# Patient Record
Sex: Female | Born: 1985 | Race: Black or African American | Hispanic: No | Marital: Single | Smoking: Current every day smoker
Health system: Southern US, Community
[De-identification: ages and names within clinical notes are randomized; demographics above are authoritative.]

## PROBLEM LIST (undated history)

## (undated) ENCOUNTER — Inpatient Hospital Stay (HOSPITAL_COMMUNITY): Payer: Self-pay

## (undated) DIAGNOSIS — O139 Gestational [pregnancy-induced] hypertension without significant proteinuria, unspecified trimester: Secondary | ICD-10-CM

## (undated) DIAGNOSIS — R51 Headache: Secondary | ICD-10-CM

## (undated) DIAGNOSIS — A549 Gonococcal infection, unspecified: Secondary | ICD-10-CM

## (undated) HISTORY — PX: TUBAL LIGATION: SHX77

## (undated) HISTORY — PX: INDUCED ABORTION: SHX677

---
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
## 2006-04-14 ENCOUNTER — Ambulatory Visit (HOSPITAL_COMMUNITY): Admission: RE | Admit: 2006-04-14 | Discharge: 2006-04-14 | Payer: Self-pay | Admitting: Obstetrics and Gynecology

## 2006-05-10 ENCOUNTER — Ambulatory Visit (HOSPITAL_COMMUNITY): Admission: RE | Admit: 2006-05-10 | Discharge: 2006-05-10 | Payer: Self-pay | Admitting: Obstetrics & Gynecology

## 2006-07-24 ENCOUNTER — Ambulatory Visit: Payer: Self-pay | Admitting: Obstetrics and Gynecology

## 2006-07-27 ENCOUNTER — Ambulatory Visit: Payer: Self-pay | Admitting: Obstetrics & Gynecology

## 2006-07-27 ENCOUNTER — Inpatient Hospital Stay (HOSPITAL_COMMUNITY): Admission: AD | Admit: 2006-07-27 | Discharge: 2006-07-29 | Payer: Self-pay | Admitting: Obstetrics & Gynecology

## 2007-04-20 ENCOUNTER — Inpatient Hospital Stay (HOSPITAL_COMMUNITY): Admission: AD | Admit: 2007-04-20 | Discharge: 2007-04-20 | Payer: Self-pay | Admitting: Gynecology

## 2007-07-03 ENCOUNTER — Ambulatory Visit (HOSPITAL_COMMUNITY): Admission: RE | Admit: 2007-07-03 | Discharge: 2007-07-03 | Payer: Self-pay | Admitting: Obstetrics & Gynecology

## 2007-12-13 ENCOUNTER — Inpatient Hospital Stay (HOSPITAL_COMMUNITY): Admission: RE | Admit: 2007-12-13 | Discharge: 2007-12-15 | Payer: Self-pay | Admitting: Obstetrics & Gynecology

## 2007-12-13 ENCOUNTER — Ambulatory Visit: Payer: Self-pay | Admitting: Family Medicine

## 2009-07-09 ENCOUNTER — Inpatient Hospital Stay (HOSPITAL_COMMUNITY): Admission: RE | Admit: 2009-07-09 | Discharge: 2009-07-09 | Payer: Self-pay | Admitting: Obstetrics and Gynecology

## 2009-07-10 ENCOUNTER — Inpatient Hospital Stay (HOSPITAL_COMMUNITY): Admission: RE | Admit: 2009-07-10 | Discharge: 2009-07-13 | Payer: Self-pay | Admitting: Obstetrics and Gynecology

## 2009-07-10 ENCOUNTER — Encounter (INDEPENDENT_AMBULATORY_CARE_PROVIDER_SITE_OTHER): Payer: Self-pay | Admitting: Obstetrics and Gynecology

## 2009-07-16 ENCOUNTER — Emergency Department (HOSPITAL_COMMUNITY): Admission: EM | Admit: 2009-07-16 | Discharge: 2009-07-16 | Payer: Self-pay | Admitting: Family Medicine

## 2009-07-16 ENCOUNTER — Inpatient Hospital Stay (HOSPITAL_COMMUNITY): Admission: AD | Admit: 2009-07-16 | Discharge: 2009-07-16 | Payer: Self-pay | Admitting: Obstetrics and Gynecology

## 2010-01-24 ENCOUNTER — Encounter: Payer: Self-pay | Admitting: Internal Medicine

## 2010-03-21 LAB — COMPREHENSIVE METABOLIC PANEL
ALT: 18 U/L (ref 0–35)
ALT: 45 U/L — ABNORMAL HIGH (ref 0–35)
AST: 25 U/L (ref 0–37)
Albumin: 2.9 g/dL — ABNORMAL LOW (ref 3.5–5.2)
Alkaline Phosphatase: 127 U/L — ABNORMAL HIGH (ref 39–117)
Alkaline Phosphatase: 93 U/L (ref 39–117)
GFR calc Af Amer: >60 mL/min (ref >60)
Glucose, Bld: 80 mg/dL (ref 70–99)
Potassium: 3.8 mEq/L (ref 3.5–5.1)
Potassium: 4 mEq/L (ref 3.5–5.1)
Sodium: 136 mEq/L (ref 135–145)
Sodium: 137 mEq/L (ref 135–145)
Total Protein: 6.2 g/dL (ref 6.0–8.3)
Total Protein: 6.4 g/dL (ref 6.0–8.3)

## 2010-03-21 LAB — CBC
HCT: 33.2 % — ABNORMAL LOW (ref 36.0–46.0)
HCT: 36.6 % (ref 36.0–46.0)
Hemoglobin: 11.2 g/dL — ABNORMAL LOW (ref 12.0–15.0)
Hemoglobin: 12.3 g/dL (ref 12.0–15.0)
MCH: 30.9 pg (ref 26.0–34.0)
MCHC: 33.7 g/dL (ref 30.0–36.0)
MCV: 90.6 fL (ref 78.0–100.0)
Platelets: 233 10*3/uL (ref 150–400)
RBC: 3.83 MIL/uL — ABNORMAL LOW (ref 3.87–5.11)
RDW: 14.9 % (ref 11.5–15.5)
WBC: 12.8 10*3/uL — ABNORMAL HIGH (ref 4.0–10.5)
WBC: 9.9 10*3/uL (ref 4.0–10.5)

## 2010-03-21 LAB — URINE MICROSCOPIC-ADD ON

## 2010-03-21 LAB — RPR: RPR Ser Ql: NONREACTIVE

## 2010-03-21 LAB — URINALYSIS, ROUTINE W REFLEX MICROSCOPIC
Glucose, UA: NEGATIVE mg/dL
Ketones, ur: NEGATIVE mg/dL
Protein, ur: NEGATIVE mg/dL

## 2010-03-21 LAB — LACTATE DEHYDROGENASE: LDH: 199 U/L (ref 94–250)

## 2010-05-18 NOTE — Discharge Summary (Signed)
NAME:  [name], [name]             ACCOUNT NO.:  [id number]   MEDICAL RECORD NO.:  [id number]          PATIENT TYPE:  INP   LOCATION:  9111                          FACILITY:  [facility]   PHYSICIAN:  Tilda Burrow, M.D. DATE OF BIRTH:  1985/02/17   DATE OF ADMISSION:  12/13/2007  DATE OF DISCHARGE:                               DISCHARGE SUMMARY   At the time of her admission, the patient was a gravida 3, para 2-0-0-3  at 39 weeks with preoperative diagnosis of previous low transverse  cesarean section at term with desire for repeat postoperative diagnosis  is repeat low-transverse cesarean section at term.   PROCEDURE PERFORMED:  Repeat low-transverse cesarean section.   ADMISSION COURSE:  Please refer to the patient's written H&P, but in  brief, she is a 25 year old gravida 3, para 2 with one previous C-  section who desires a repeat.   ALLERGIES:  She has no known drug allergies.   PAST MEDICAL HISTORY:  Noncontributory.   FAMILY HISTORY:  Noncontributory.  On admission, her hemoglobin was 11.9.   MEDICATIONS ON ADMISSION:  Prenatal vitamins one p.o. daily.   HOSPITAL COURSE:  The patient's hospital course again with cesarean  section that was performed by Dr. Shawnie Pons and Dr. Noel Gerold on December 13, 2007 at approximately 1300.  The product of that surgery produced an 8  pounds 6 ounce viable female infant with Apgars of 8 and 9.  Please see  the dictated operative note for details of this surgery.  Throughout her  hospital course, her vital signs remained stable.  Her exam is within  normal limits.  On postoperative day #1, her hemoglobin was found to be  10.3, hematocrit 30.1.  On postoperative day #2, the patient desires  early discharge.  She was afebrile with vital signs stable.  Her exam  was within normal limits.  Uterus firm and nontender.  Breasts soft and  nontender.  Lochia:  Scant, rubra.  Her incision or staples were clean,  dry, and intact.  Extremities had no pain  with trace edema.   DISCHARGE DIAGNOSIS:  The patient is a 25 year old gravida 3, para 3-0-0-  3 postoperative day #2 status post repeat low-transverse cesarean  section.   DISCHARGE PLAN:  Discharge home on postoperative day #2, December 15, 2007.  The patient is to receive Depo-Provera 150 mg IM on discharge.  She is breast and bottle feeding.  She is to continue prenatal vitamins  one p.o. daily while breast-feeding, maternal care coordinator through  the baby love nurses to discontinue staples on postoperative day 5  through 7.  She is to follow up at her scheduled appointment in 6 weeks  at the [facility].   DISCHARGE MEDICATIONS:  Prescriptions given for:  1. Percocet 5/325 one to two tablets q. 4-6 h. p.r.n. pain, she was      dispensed #30 with no refill.  2. Ibuprofen 600 mg q.6 h. p.r.n. cramping dispensed #30 with no      refills.      Maylon Cos, C.N.M.      ______________________________  Tilda Burrow, M.D.    SS/MEDQ  D:  12/15/2007  T:  12/15/2007  Job:  [id number]

## 2010-05-18 NOTE — Op Note (Signed)
NAME:  [name], Sherry             ACCOUNT NO.:  [id number]   MEDICAL RECORD NO.:  [id number]          PATIENT TYPE:  INP   LOCATION:  9116                          [facility]  [facility]   PHYSICIAN:  Allie Bossier, MD        DATE OF BIRTH:  11/01/1985   DATE OF PROCEDURE:  07/27/2006  DATE OF DISCHARGE:                               OPERATIVE REPORT   PREOPERATIVE DIAGNOSIS:  Was 41 weeks estimated gestational age and  arrest of descent and dilatation.   POSTOPERATIVE DIAGNOSIS:  Was 41 weeks estimated gestational age and  arrest of descent and dilatation.   PROCEDURE:  Primary low transverse cesarean section.   SURGEON:  Allie Bossier, M.D.   ANESTHESIA:  Epidural Sherron Ales, M.D.   COMPLICATIONS:  None.   ESTIMATED BLOOD LOSS:  600 mL.   SPECIMENS:  Cord blood.   FINDINGS:  1. Living female infant, Apgars 8 and 9 at 1 and 5 minutes, weight 8      pounds 2 ounces.  2. Normal pelvic anatomy.  3. Intact placenta with three-vessel cord.   PROCEDURE:  The risks, benefits, alternatives of surgery were explained,  understood and accepted, consents were signed. She was taken to the  operating room.  Epidural was bolused for surgery. Abdomen was prepped  and draped usual sterile fashion.  A Foley catheter had been placed in  the labor [place] and it drained clear urine throughout the case. After  adequate anesthesia was assured.  A transverse incision was made  approximately 2 cm above the symphysis pubis. Incision was carried down  to the subcutaneous tissue and the fascia.  Bleeding was cauterized with  the Bovie.  Fascial incision was extended bilaterally with Mayo  scissors.  The pyramidalis and the rectus muscles were partially  separated in transverse fashion using electrosurgical technique.  Excellent hemostasis was maintained. Peritoneum was entered with  hemostat.  The peritoneal incision was extended bilaterally and curved  slightly upward taking care to avoid the bladder.  A  bladder blade was  placed, transverse incision was made on the well-developed lower uterine  segment.  Amniotomy was performed with hemostats and clear fluid was  noted.  The uterine incision was extended manually.  The baby was noted  to be in the occiput posterior position with head was delivered from a  vertex lie.  There was a marked amount of caput and molding. Mouth and  nose were suctioned prior to delivery of shoulders.  The cord clamped  and cut. The baby was transferred to the pediatricians for routine care.  Weight and Apgars are listed above.  Cord blood samples obtained.  The  placenta was extracted with traction.  The uterus was left in situ. The  interior of the uterus cleaned with dry lap sponge.  A bladder blade was  placed.  The uterine incision was closed with one layer of 0 chromic  running locking suture.  Excellent hemostasis was noted.  The ovaries  were palpably normal.  The uterine incision was again inspected and  noted to be hemostatic as were the  rectus muscles and rectus fascia.  The fascia was then closed with 0 Vicryl running nonlocking suture.  No  defects were palpable.  The subcutaneous tissue was irrigated clean and dry, it was infiltrated  with 30 mL of 0.5% Marcaine.  A subcuticular closure was done with 3-0  Vicryl.  Steri-Strips were placed across her incision.  She was taken to  recovery in stable condition.      Allie Bossier, MD  Electronically Signed     MCD/MEDQ  D:  07/27/2006  T:  07/28/2006  Job:  [id number]

## 2010-05-18 NOTE — Op Note (Signed)
NAME:  [name], [name]             ACCOUNT NO.:  [id number]   MEDICAL RECORD NO.:  [id number]          PATIENT TYPE:  INP   LOCATION:  9111                          [facility]   PHYSICIAN:  Tanya S. Shawnie Pons, M.D.   DATE OF BIRTH:  08/24/1985   DATE OF PROCEDURE:  12/13/2007  DATE OF DISCHARGE:                               OPERATIVE REPORT   PREOPERATIVE DIAGNOSES:  1. Previous low-transverse cesarean section, at term.  2. Intrauterine pregnancy.   POSTOPERATIVE DIAGNOSES:  1. Previous low-transverse cesarean section, at term.  2. Intrauterine pregnancy.   PROCEDURE:  Repeat low-transverse cesarean section.   SURGEON:  Shelbie Proctor. Shawnie Pons, MD   ASSISTANT:  Odie Sera, DO   ANESTHESIA:  Spinal and local, Octaviano Glow. Pamalee Leyden, MD   FINDINGS:  Viable female infant, Apgars 8 and 9.  Weight 8 pounds 6  ounces.   SPECIMENS:  Placenta to labor and delivery.   ESTIMATED BLOOD LOSS:  1000 mL.   COMPLICATIONS:  None known.   REASON FOR PROCEDURE:  The patient is a 25 year old gravida 3 para 2,  who has had 1 previous cesarean section, who desired repeat.  The  patient was counseled regarding the risks and benefits of the procedure  including risk of increasing scar tissue and worsening outcomes with  more and more C-sections.  The patient wanted to proceed with C-section.   DESCRIPTION OF PROCEDURE:  The patient was taken to the OR.  She was  placed in a supine position with left lateral tilt.  She was prepped and  draped in a usual sterile fashion.  A Foley catheter was placed inside  the bladder.  When the spinal analgesia was felt to be adequate, a  Pfannenstiel incision was made through an old scar and was carried down  to underlying fascia sharply.  The Kocher clamps were used to elevate  the superior and inferior edges of the fascia off the underlying rectus  bluntly laterally and sharply in the midline.  The rectus was then  divided in the midline and the peritoneal cavity  entered.  Blunt  dissection was used to open the incision even further.  The scissors  were used to take down the inferior portion of the rectus, which was  severely adherent in the midline.  This was taken down in layers with  care being taken to avoid the bladder.  An Alexis retractor was placed  inside the incision.  A low-transverse incision was made on the uterus  until the amniotic cavity was entered.  This was done sharply in the  middle and then the incision extended laterally and bluntly.  The  rupture of membranes occurred with an Allis clamp.  Clear fluid was  noted.  Infant was in a vertex position, was female, had a nuchal cord x1,  was brought out and delivered easily.  Cord was clamped x2 and cut.  Infant handed to awaiting peds.  Placenta was delivered without  difficulty.  The membranes were teased out of the uterine cavity.  The  uterine cavity cleaned with a dry lap pad.  Edges of  the uterine  incision grasped with ring forceps.  Uterine incision closed with 0-  Vicryl suture in a locked running fashion.  Two figure-of-eight were  used to control hemostasis.  The fascia was then closed with 0-Vicryl in  a running fashion.  Subcuticular tissue irrigated, any bleeders  cauterized and the skin closed using clips.  A 30 mL of 0.25% Marcaine  were injected about the incision.  Pressure dressing was applied.  All  instrument and lap counts correct x2.  The patient was awakened and  taken to the recovery room in stable condition.      Shelbie Proctor. Shawnie Pons, M.D.  Electronically Signed     TSP/MEDQ  D:  12/13/2007  T:  12/14/2007  Job:  [id number]

## 2010-05-18 NOTE — Consult Note (Signed)
NAME:  [name], [name]             ACCOUNT NO.:  [id number]   MEDICAL RECORD NO.:  [id number]          PATIENT TYPE:  INP   LOCATION:  9116                          [facility]   PHYSICIAN:  Lazaro Arms, M.D.   DATE OF BIRTH:  02-28-1985   DATE OF CONSULTATION:  DATE OF DISCHARGE:  07/29/2006                                 CONSULTATION   REASON FOR CONSULTATION:  [name] is postoperative day #2 for primary C-  section secondary to arrest of labor.  She is a gravida 2, para 2, who  came into labor at 41 weeks.   HOSPITAL COURSE:  Got to be about 9 centimeters and just basically  stopped.  She had a LTCS on July 27, 2006 and her postpartum course has  been without complications.   LABORATORY DATA:  White blood cells this morning are 12.5.  She does  have a hemoglobin of 7.6 and hematocrit of 22.4.   PHYSICAL EXAMINATION:  HEART:  Regular rate and rhythm.  LUNGS:  Clear.  ABDOMEN:  Soft and nontender.  LTCS incision looks great with Steri-  Strips in place.  Fundus is firm at U-2.  Lochia is small and legs are  negative.   IMPRESSION:  Postoperative day #2 status post primary low transverse  cesarean section.   PLAN:  The patient strongly desires discharge so we will let her go home  this afternoon.  She is being given prescriptions for Percocet, Motrin  and iron supplements.  She also has an appointment to follow up next  week for postpartum care.      Jacklyn Shell, C.N.M.      Lazaro Arms, M.D.  Electronically Signed    FC/MEDQ  D:  07/29/2006  T:  07/29/2006  Job:  [id number]

## 2010-05-21 NOTE — Discharge Summary (Signed)
NAME:  [name], [name]             ACCOUNT NO.:  [id number]   MEDICAL RECORD NO.:  [id number]          PATIENT TYPE:  INP   LOCATION:  9116                          [facility]   PHYSICIAN:  Tilda Burrow, M.D. DATE OF BIRTH:  10/24/1985   DATE OF ADMISSION:  07/27/2006  DATE OF DISCHARGE:  07/29/2006                               DISCHARGE SUMMARY   HOSPITAL COURSE:  Trial of labor which resulted in a low transverse  cesarean section without complications.   LABORATORY DATA:  Hemoglobin was 7.6, hematocrit 22.4.   PHYSICAL EXAMINATION:  HEART:  Regular rate and rhythm.  LUNGS:  Clear to auscultation bilaterally.  ABDOMEN:  Soft and nontender.  Bowel sounds are active.  Incision intact  with Steri-strips in place. No redness, drainage or swelling.  Fundus is  firm.  PELVIC:  Lochia is a small amount.  EXTREMITIES:  Legs are without edema.   IMPRESSION:  Postoperative day 2 status post low transverse cesarean  section.  We are going to discharge her home with prescriptions for  Percocet, Motrin, and iron supplements.  She also has an appointment to  follow up next week in the office for her postpartum care.      Zerita Boers, Lanier Clam      Tilda Burrow, M.D.  Electronically Signed    DL/MEDQ  D:  98/11/9145  T:  09/04/2006  Job:  [id number]   cc:   [facility]

## 2010-05-21 NOTE — Discharge Summary (Signed)
NAME:  [name], [name]             ACCOUNT NO.:  [id number]   MEDICAL RECORD NO.:  [id number]          PATIENT TYPE:  INP   LOCATION:  9167                          [facility]   PHYSICIAN:  Ginger Carne, MD  DATE OF BIRTH:  01/05/1985   DATE OF ADMISSION:  06/04/2005  DATE OF DISCHARGE:  06/05/2005                                 DISCHARGE SUMMARY   REASON FOR HOSPITALIZATION:  A 41 and 0/7ths week gestation for induction  for post dates.   IN-HOSPITAL PROCEDURES:  Cervidil induction placement.   FINAL DIAGNOSIS:  A 41 and 1/7ths week gestation with failed medical  induction, uninducible cervix.   HISTORY OF PRESENT ILLNESS:  This is a 25 year old African-American female  gravida 1, para 0, EDC May 28, 2005 by LMP and second trimester ultrasound,  presenting at 49 and 0/7ths weeks gestation for induction.  The patient was  long, closed, thick and -2 to -3 station.  Once Cervidil was placed with  minimal benefit due to the patient's uninducible cervix and lack of change  over the course of the evening, the patient was reevaluated.  Her cervix  remained unchanged.  A biophysical profile including an AFI demonstrated an  AFI of 12.3 with 8/8 on BPP.  The patient's nonstress test pattern  demonstrated no decelerations.  Good acceleration pattern noted.  The  patient, therefore, was discharged and will return to the high risk clinic  for a nonstress test on June 08, 2005 and was rescheduled for June 10, 2005  for induction, at which time she will be  41 and 6/7ths weeks gestation.  Labor precautions discussed.  She can take Tylenol 1-2 every 6 hours as  needed for discomfort.      Ginger Carne, MD  Electronically Signed     SHB/MEDQ  D:  06/05/2005  T:  06/06/2005  Job:  [id number]

## 2010-09-04 ENCOUNTER — Emergency Department (HOSPITAL_COMMUNITY)
Admission: EM | Admit: 2010-09-04 | Discharge: 2010-09-04 | Disposition: A | Discharge status: Home / Self Care | Payer: Self-pay | Attending: Emergency Medicine | Admitting: Emergency Medicine

## 2010-09-04 DIAGNOSIS — J069 Acute upper respiratory infection, unspecified: Secondary | ICD-10-CM | POA: Insufficient documentation

## 2010-09-04 DIAGNOSIS — J029 Acute pharyngitis, unspecified: Secondary | ICD-10-CM | POA: Insufficient documentation

## 2010-09-04 DIAGNOSIS — R5383 Other fatigue: Secondary | ICD-10-CM | POA: Insufficient documentation

## 2010-09-04 DIAGNOSIS — IMO0001 Reserved for inherently not codable concepts without codable children: Secondary | ICD-10-CM | POA: Insufficient documentation

## 2010-09-04 DIAGNOSIS — R5381 Other malaise: Secondary | ICD-10-CM | POA: Insufficient documentation

## 2010-09-04 DIAGNOSIS — J3489 Other specified disorders of nose and nasal sinuses: Secondary | ICD-10-CM | POA: Insufficient documentation

## 2010-09-04 LAB — URINALYSIS, ROUTINE W REFLEX MICROSCOPIC
Glucose, UA: NEGATIVE mg/dL
Hgb urine dipstick: NEGATIVE
Protein, ur: 30 mg/dL — AB
Specific Gravity, Urine: 1.024 (ref 1.005–1.030)
pH: 7 (ref 5.0–8.0)

## 2010-09-04 LAB — URINE MICROSCOPIC-ADD ON

## 2010-09-28 LAB — POCT PREGNANCY, URINE
Operator id: 292781
Preg Test, Ur: POSITIVE

## 2010-09-28 LAB — WET PREP, GENITAL

## 2010-09-28 LAB — CBC
HCT: 39.4
Hemoglobin: 13.6
MCV: 89.4
RBC: 4.41
WBC: 10.5

## 2010-10-07 LAB — CBC
HCT: 30.1 % — ABNORMAL LOW (ref 36.0–46.0)
Hemoglobin: 10.3 g/dL — ABNORMAL LOW (ref 12.0–15.0)
Hemoglobin: 11.9 g/dL — ABNORMAL LOW (ref 12.0–15.0)
MCHC: 33.5 g/dL (ref 30.0–36.0)
MCV: 91.2 fL (ref 78.0–100.0)
MCV: 92 fL (ref 78.0–100.0)
Platelets: 202 10*3/uL (ref 150–400)
RBC: 3.88 MIL/uL (ref 3.87–5.11)
RDW: 14.8 % (ref 11.5–15.5)
WBC: 12.4 10*3/uL — ABNORMAL HIGH (ref 4.0–10.5)
WBC: 12.7 10*3/uL — ABNORMAL HIGH (ref 4.0–10.5)

## 2010-10-18 LAB — CBC
HCT: 34.4 — ABNORMAL LOW
Hemoglobin: 11.7 — ABNORMAL LOW
MCHC: 34
MCHC: 35
MCV: 91.2
MCV: 91.2
Platelets: 193
Platelets: 215
Platelets: 240
RBC: 2.45 — ABNORMAL LOW
RBC: 2.52 — ABNORMAL LOW
RDW: 14.7 — ABNORMAL HIGH
WBC: 12.5 — ABNORMAL HIGH

## 2011-01-07 IMAGING — US US AMNIOCENTESIS
1 series · 7 of 7 positions shown · non-contrast
Comparison: none

ULTRASOUND-GUIDED AMNIOCENTESIS:
 Ultrasound was utilized to perform amniocentesis by the requesting physician.

[Series 1: us amniocentesis · 7 of 7 slices shown]
[im 1/7]
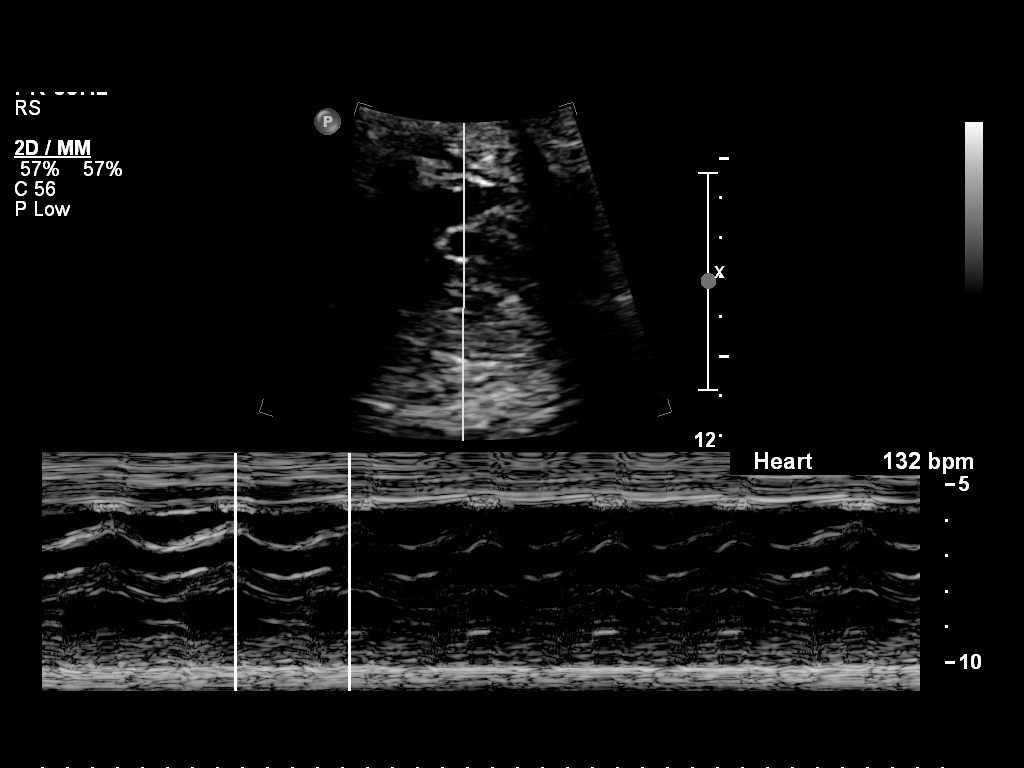
[im 2/7]
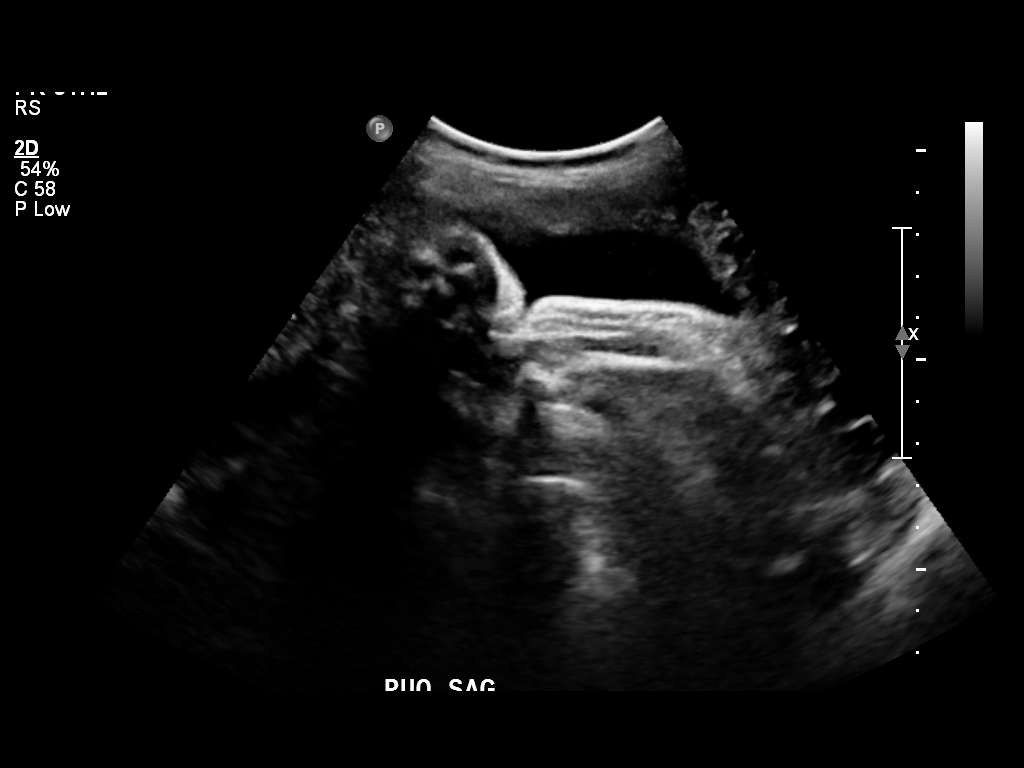
[im 3/7]
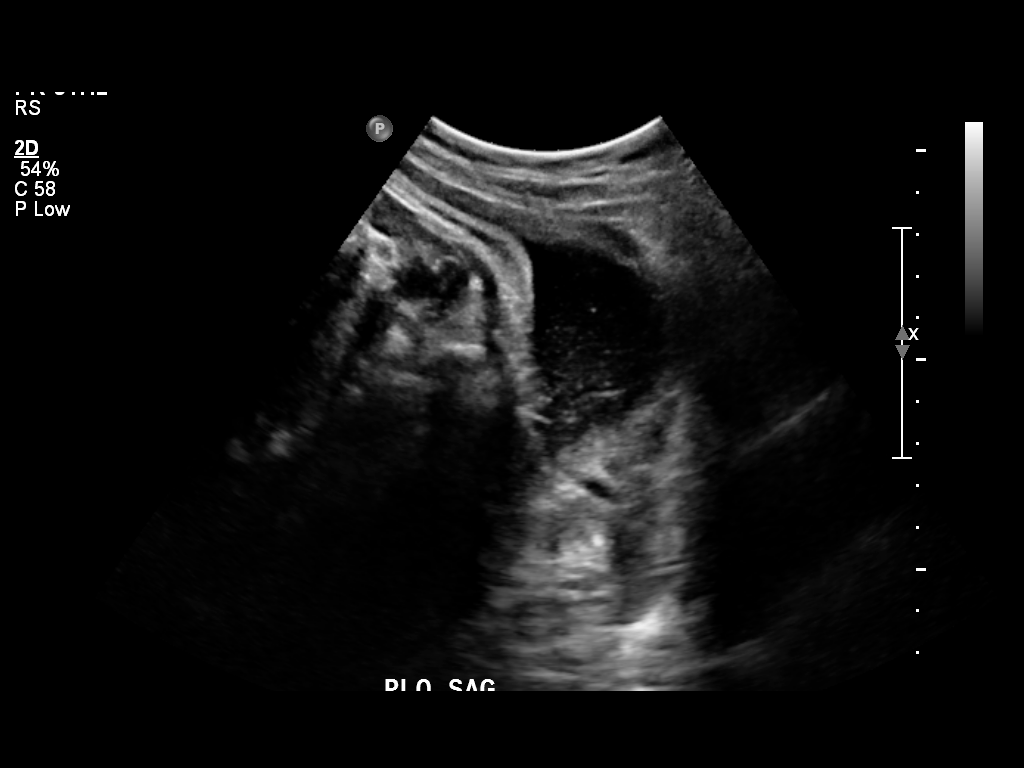
[im 4/7]
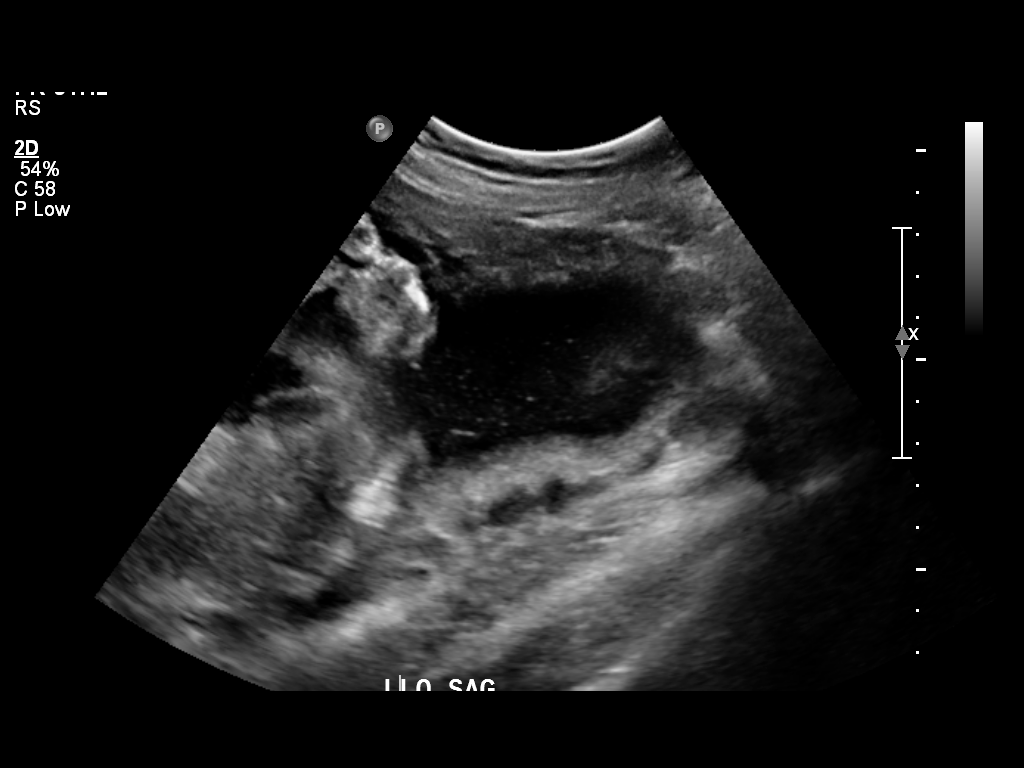
[im 5/7]
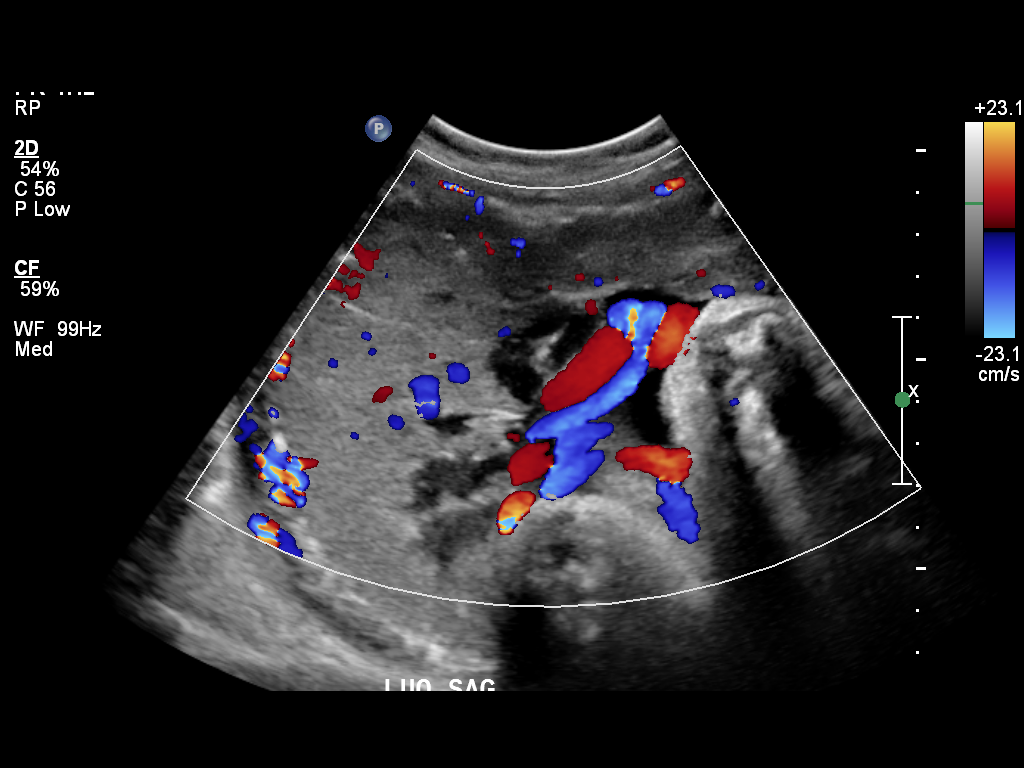
[im 6/7]
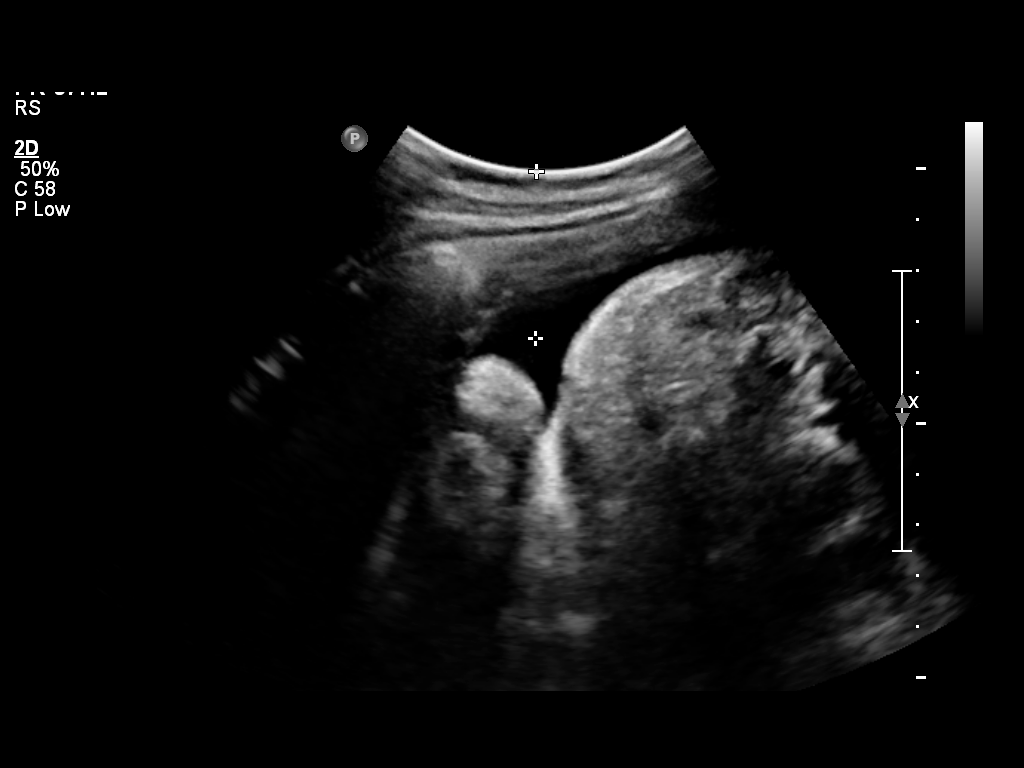
[im 7/7]
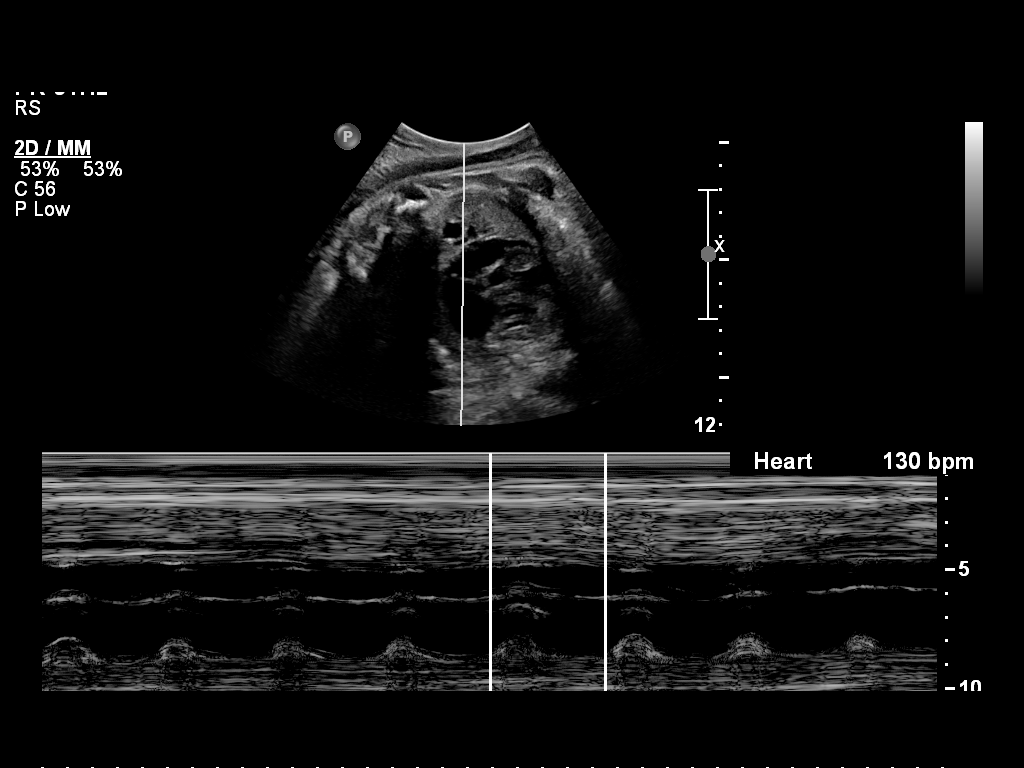

[7 of 7 positions shown; findings below may reference images not displayed]

IMPRESSION: No radiographic interpretation.

## 2011-05-17 ENCOUNTER — Inpatient Hospital Stay (HOSPITAL_COMMUNITY)
Admission: AD | Admit: 2011-05-17 | Discharge: 2011-05-17 | Disposition: A | Discharge status: Home / Self Care | Payer: Self-pay | Source: Ambulatory Visit | Attending: Obstetrics & Gynecology | Admitting: Obstetrics & Gynecology

## 2011-05-17 ENCOUNTER — Encounter (HOSPITAL_COMMUNITY): Payer: Self-pay

## 2011-05-17 DIAGNOSIS — O479 False labor, unspecified: Secondary | ICD-10-CM

## 2011-05-17 DIAGNOSIS — O2390 Unspecified genitourinary tract infection in pregnancy, unspecified trimester: Secondary | ICD-10-CM | POA: Insufficient documentation

## 2011-05-17 DIAGNOSIS — M545 Low back pain: Secondary | ICD-10-CM

## 2011-05-17 DIAGNOSIS — B9689 Other specified bacterial agents as the cause of diseases classified elsewhere: Secondary | ICD-10-CM | POA: Insufficient documentation

## 2011-05-17 DIAGNOSIS — A499 Bacterial infection, unspecified: Secondary | ICD-10-CM | POA: Insufficient documentation

## 2011-05-17 DIAGNOSIS — M549 Dorsalgia, unspecified: Secondary | ICD-10-CM | POA: Insufficient documentation

## 2011-05-17 DIAGNOSIS — O99891 Other specified diseases and conditions complicating pregnancy: Secondary | ICD-10-CM | POA: Insufficient documentation

## 2011-05-17 DIAGNOSIS — N760 Acute vaginitis: Secondary | ICD-10-CM | POA: Insufficient documentation

## 2011-05-17 HISTORY — DX: Gonococcal infection, unspecified: A54.9

## 2011-05-17 HISTORY — DX: Headache: R51

## 2011-05-17 HISTORY — DX: Gestational (pregnancy-induced) hypertension without significant proteinuria, unspecified trimester: O13.9

## 2011-05-17 LAB — URINALYSIS, ROUTINE W REFLEX MICROSCOPIC
Bilirubin Urine: NEGATIVE
Glucose, UA: NEGATIVE mg/dL
Hgb urine dipstick: NEGATIVE
Ketones, ur: NEGATIVE mg/dL
Leukocytes, UA: NEGATIVE
Nitrite: NEGATIVE
Protein, ur: NEGATIVE mg/dL
Specific Gravity, Urine: 1.01 (ref 1.005–1.030)
Urobilinogen, UA: 0.2 mg/dL (ref 0.0–1.0)
pH: 7 (ref 5.0–8.0)

## 2011-05-17 LAB — GC/CHLAMYDIA PROBE AMP, GENITAL
Chlamydia, DNA Probe: NEGATIVE
GC Probe Amp, Genital: NEGATIVE

## 2011-05-17 LAB — WET PREP, GENITAL
Trich, Wet Prep: NONE SEEN
Yeast Wet Prep HPF POC: NONE SEEN

## 2011-05-17 MED ORDER — METRONIDAZOLE 500 MG PO TABS: 500.0000 mg | ORAL_TABLET | Freq: Two times a day (BID) | ORAL | Status: AC

## 2011-05-17 MED ORDER — PRENATAL VITAMINS (DIS) PO TABS: 1.0000 | ORAL_TABLET | Freq: Every day | ORAL | Status: DC

## 2011-05-17 MED ORDER — ACETAMINOPHEN 325 MG PO TABS: 325.0000 mg | ORAL_TABLET | Freq: Four times a day (QID) | ORAL | Status: DC | PRN

## 2011-05-17 NOTE — MAU Note (Signed)
Leg pain started 2 wks ago, was hoping it would go away or could wait until she saw a regular doctor.  Dizziness is off and on, had dizzy spell yesterday at work.

## 2011-05-17 NOTE — MAU Note (Signed)
Patient states she has had no prenatal care. Has had three previous cesareans. States she has been having hip and leg pain for about 2 weeks, worse when moving. Has a thick vaginal discharge with slight odor.

## 2011-05-17 NOTE — MAU Provider Note (Signed)
Attestation of Attending Supervision of Advanced Practitioner: Evaluation and management procedures were performed by the OB Fellow/PA/CNM/NP under my supervision and collaboration. Chart reviewed, and agree with management and plan.  Payslie Mccaig, M.D. 05/17/2011 5:51 PM   

## 2011-05-17 NOTE — MAU Provider Note (Signed)
  History     CSN: [id number]  Arrival date and time: 05/17/11 6644   First Provider Initiated Contact with Patient 05/17/11 [phone]      Chief Complaint  Patient presents with  . Hip Pain   HPI CC: Back and leg pain, vaginal discharge, lightheadedness   Back pain: Lower back pain off and on for 2 wks that is ache in nature. Aggrevated w/ walking and certain movements. Alleviated w/ rest. Occasionally associated w/ leg pain (R>L) in her thighs, but no numbness, loss of strength, loss of bowel or bladder function.   Vaginal discharge: whitish discharge for the past wk associated w/ a foul odor. Denies any new sexual partners, vaginal bleeding. Has had previous STD. Sexually active w/ FOB. Last intercourse several days ago.   Lightheadedness: Onset 5 days ago. Occasionally lightheaded while standing for long periods of time while at work. Denies orthostatic symptoms, syncope, HA, palpitations, SOB, CP, change in sensation, HA. Denies decreased PO but admits to poor daily water/fluid intake.    Reports regular fetal movement and no contractions  OB History    Grav Para Term Preterm Abortions TAB SAB Ect Mult Living   6 4 4  0 1 1 0 0 0 4      Past Medical History  Diagnosis Date  . Headache   . Gonorrhea   . Pregnancy induced hypertension     readm 1 wk after 4th delivery    Past Surgical History  Procedure Date  . Cesarean section   . Induced abortion     Family History  Problem Relation Age of Onset  . Anesthesia problems Neg Hx     History  Substance Use Topics  . Smoking status: Former Games developer  . Smokeless tobacco: Never Used   Comment: quit in March 2013  . Alcohol Use: No    Allergies: No Known Allergies  No prescriptions prior to admission    ROS Physical Exam   Blood pressure 118/67, pulse 93, temperature 98 F (36.7 C), temperature source Oral, resp. rate 16, height 5\' 5"  (1.651 m), weight 79.833 kg (176 lb), last menstrual period 12/04/2010, SpO2  100.00%, unknown if currently breastfeeding.  Physical Exam  Gen: WN/WD, no distress CV: RRR Abd: Non-painful to palpation, uterus firm and above the umbilicus GU: whitish, no erythema or bleeding. Cervix long, thick, and closed on speculum though difficult to appreciate.  Ext: No edema   MAU Course  Procedures  MDM GC/Chlamydia Wet prep Orthostatics UA  Assessment and Plan  26yo [redacted]w[redacted]d [id number] w/ typical pregnancy induced back pain and likely early sciatica, w/ BV and pending GC/Chlamydia, w/ need for better fluid intake. No cardiac cause of lightheadedness. Monitoring w/  reasurring fetal strip and absent of contractions. - Metro 500mg  BID 7days for BV - F/U GC chlamydia - Tylenol for pain - PNV - Pt to establish PNC at health department - Increase fluid intake - Reasons for return discussed and handout given - Labor precautions discussed   MERRELL, DAVID, MD 05/17/2011, 10:28 AM   I have seen and examined this patient and I agree with the above. Cam Hai 12:13 PM 05/17/2011

## 2011-05-17 NOTE — Discharge Instructions (Signed)
Thank you for coming in today. We will let you know the results of your lab work if anything comes back abnormal. Please increase your fluid intake throughout the day, as this will help with the lightheadedness. Please take tylenol as needed for your back pain. Massage and warm compresses will also help.   Please go to the health department as soon as possible to start your prenatal care. This is very important for your health and the health of the baby.   Please start taking a prenatal vitamin daily.     Pregnancy - Third Trimester The third trimester begins at the 28th week of pregnancy and ends at birth. It is important to follow your doctor's instructions. HOME CARE   Go to your doctor's visits.   Do not smoke.   Do not drink alcohol or use drugs.   Only take medicine as told by your doctor.   Take prenatal vitamins as told. The vitamin should contain 1 milligram of folic acid.   Exercise.   Eat healthy foods. Eat regular, well-balanced meals.   You can have sex (intercourse) if there are no other problems with the pregnancy.   Do not use hot tubs, steam rooms, or saunas.   Wear a seat belt while driving.   Avoid raw meat, uncooked cheese, and litter boxes and soil used by cats.   Rest with your legs raised (elevated).   Make a list of emergency phone numbers. Keep this list with you.   Arrange for help when you come back home after delivering the baby.   Make a trial run to the hospital.   Take prenatal classes.   Prepare the baby's nursery.   Do not travel out of the city. If you absolutely have to, get permission from your doctor first.   Wear flat shoes. Do not wear high heels.  GET HELP RIGHT AWAY IF:   You have a temperature by mouth above 102 F (38.9 C), not controlled by medicine.   You have not felt the baby move for more than 1 hour. If you think the baby is not moving as much as normal, eat something with sugar in it or lie down on your left side  for an hour. The baby should move at least 4 to 5 times per hour.   Fluid is coming from the vagina.   Blood is coming from the vagina. Light spotting is common, especially after sex (intercourse).   You have belly (abdominal) pain.   You have a bad smelling fluid (discharge) coming from the vagina. The fluid changes from clear to white.   You still feel sick to your stomach (nauseous).   You throw up (vomit) for more than 24 hours.   You have the chills.   You have shortness of breath.   You have a burning feeling when you pee (urinate).   You lose or gain more than 2 pounds (0.9 kilograms) of weight over a week, or as told by your doctor.   Your face, hands, feet, or legs get puffy (swell).   You have a bad headache that will not go away.   You start to have problems seeing (blurry or double vision).   You fall, are in a car accident, or have any kind of trauma.   There is mental or physical violence at home.   You have any concerns or worries during your pregnancy.  MAKE SURE YOU:   Understand these instructions.   Will watch  your condition.   Will get help right away if you are not doing well or get worse.  Document Released: 03/16/2009 Document Revised: 12/09/2010 Document Reviewed: 03/16/2009 [facility] Patient Information 2012 [place], [place]. Sciatica Sciatica is a weakness and/or changes in sensation (tingling, jolts, hot and cold, numbness) along the path the sciatic nerve travels. Irritation or damage to lumbar nerve roots is often also referred to as lumbar radiculopathy.  Lumbar radiculopathy (Sciatica) is the most common form of this problem. Radiculopathy can occur in any of the nerves coming out of the spinal cord. The problems caused depend on which nerves are involved. The sciatic nerve is the large nerve supplying the branches of nerves going from the hip to the toes. It often causes a numbness or weakness in the skin and/or muscles that the sciatic nerve  serves. It also may cause symptoms (problems) of pain, burning, tingling, or electric shock-like feelings in the path of this nerve. This usually comes from injury to the fibers that make up the sciatic nerve. Some of these symptoms are low back pain and/or unpleasant feelings in the following areas:  From the mid-buttock down the back of the leg to the back of the knee.   And/or the outside of the calf and top of the foot.   And/or behind the inner ankle to the sole of the foot.  CAUSES   Herniated or slipped disc. Discs are the little cushions between the bones in the back.   Pressure by the piriformis muscle in the buttock on the sciatic nerve (Piriformis Syndrome).   Misalignment of the bones in the lower back and buttocks (Sacroiliac Joint Derangement).   Narrowing of the spinal canal that puts pressure on or pinches the fibers that make up the sciatic nerve.   A slipped vertebra that is out of line with those above or beneath it.   Abnormality of the nervous system itself so that nerve fibers do not transmit signals properly, especially to feet and calves (neuropathy).   Tumor (this is rare).  Your caregiver can usually determine the cause of your sciatica and begin the treatment most likely to help you. TREATMENT  Taking over-the-counter painkillers, physical therapy, rest, exercise, spinal manipulation, and injections of anesthetics and/or steroids may be used. Surgery, acupuncture, and Yoga can also be effective. Mind over matter techniques, mental imagery, and changing factors such as your bed, chair, desk height, posture, and activities are other treatments that may be helpful. You and your caregiver can help determine what is best for you. With proper diagnosis, the cause of most sciatica can be identified and removed. Communication and cooperation between your caregiver and you is essential. If you are not successful immediately, do not be discouraged. With time, a proper  treatment can be found that will make you comfortable. HOME CARE INSTRUCTIONS   If the pain is coming from a problem in the back, applying ice to that area for 15 to 20 minutes, 3 to 4 times per day while awake, may be helpful. Put the ice in a plastic bag. Place a towel between the bag of ice and your skin.   You may exercise or perform your usual activities if these do not aggravate your pain, or as suggested by your caregiver.   Only take over-the-counter or prescription medicines for pain, discomfort, or fever as directed by your caregiver.   If your caregiver has given you a follow-up appointment, it is very important to keep that appointment. Not keeping  the appointment could result in a chronic or permanent injury, pain, and disability. If there is any problem keeping the appointment, you must call back to this facility for assistance.  SEEK IMMEDIATE MEDICAL CARE IF:   You experience loss of control of bowel or bladder.   You have increasing weakness in the trunk, buttocks, or legs.   There is numbness in any areas from the hip down to the toes.   You have difficulty walking or keeping your balance.   You have any of the above, with fever or forceful vomiting.  Document Released: 12/14/2000 Document Revised: 12/09/2010 Document Reviewed: 08/03/2007 [facility] Patient Information 2012 [place], [place].

## 2011-07-27 ENCOUNTER — Other Ambulatory Visit (HOSPITAL_COMMUNITY): Payer: Self-pay | Admitting: Physician Assistant

## 2011-07-27 DIAGNOSIS — Z0489 Encounter for examination and observation for other specified reasons: Secondary | ICD-10-CM

## 2011-07-27 LAB — OB RESULTS CONSOLE ABO/RH: RH Type: POSITIVE

## 2011-07-27 LAB — OB RESULTS CONSOLE GC/CHLAMYDIA
Chlamydia: NEGATIVE
Gonorrhea: NEGATIVE

## 2011-07-27 LAB — OB RESULTS CONSOLE ANTIBODY SCREEN: Antibody Screen: NEGATIVE

## 2011-07-29 ENCOUNTER — Ambulatory Visit (HOSPITAL_COMMUNITY)
Admission: RE | Admit: 2011-07-29 | Discharge: 2011-07-29 | Disposition: A | Discharge status: Home / Self Care | Payer: Medicaid Other | Source: Ambulatory Visit | Attending: Physician Assistant | Admitting: Physician Assistant

## 2011-07-29 DIAGNOSIS — O358XX0 Maternal care for other (suspected) fetal abnormality and damage, not applicable or unspecified: Secondary | ICD-10-CM | POA: Insufficient documentation

## 2011-07-29 DIAGNOSIS — Z0489 Encounter for examination and observation for other specified reasons: Secondary | ICD-10-CM

## 2011-07-29 DIAGNOSIS — Z363 Encounter for antenatal screening for malformations: Secondary | ICD-10-CM | POA: Insufficient documentation

## 2011-07-29 DIAGNOSIS — Z1389 Encounter for screening for other disorder: Secondary | ICD-10-CM | POA: Insufficient documentation

## 2011-07-29 DIAGNOSIS — O09299 Supervision of pregnancy with other poor reproductive or obstetric history, unspecified trimester: Secondary | ICD-10-CM | POA: Insufficient documentation

## 2011-08-03 ENCOUNTER — Other Ambulatory Visit: Payer: Self-pay | Admitting: Obstetrics & Gynecology

## 2011-08-20 ENCOUNTER — Encounter (HOSPITAL_COMMUNITY): Payer: Self-pay | Admitting: Pharmacist

## 2011-08-29 ENCOUNTER — Encounter (HOSPITAL_COMMUNITY)
Admission: RE | Admit: 2011-08-29 | Discharge: 2011-08-29 | Disposition: A | Discharge status: Home / Self Care | Payer: Medicaid Other | Source: Ambulatory Visit | Attending: Obstetrics & Gynecology | Admitting: Obstetrics & Gynecology

## 2011-08-29 ENCOUNTER — Encounter (HOSPITAL_COMMUNITY): Payer: Self-pay

## 2011-08-29 DIAGNOSIS — Z3009 Encounter for other general counseling and advice on contraception: Secondary | ICD-10-CM

## 2011-08-29 LAB — CBC
MCH: 30.3 pg (ref 26.0–34.0)
MCHC: 33.9 g/dL (ref 30.0–36.0)
Platelets: 228 10*3/uL (ref 150–400)
RDW: 14.5 % (ref 11.5–15.5)

## 2011-08-29 LAB — RPR: RPR Ser Ql: NONREACTIVE

## 2011-08-29 LAB — TYPE AND SCREEN: Antibody Screen: NEGATIVE

## 2011-08-29 LAB — RAPID HIV SCREEN (WH-MAU): Rapid HIV Screen: NONREACTIVE

## 2011-08-29 LAB — SURGICAL PCR SCREEN: MRSA, PCR: INVALID — AB

## 2011-08-29 NOTE — Patient Instructions (Addendum)
YOUR PROCEDURE IS SCHEDULED ON:09/03/11  ENTER THROUGH THE MAIN ENTRANCE OF [facility] AT:0730am  USE DESK PHONE AND DIAL [id number] TO INFORM us OF YOUR ARRIVAL  CALL [phone] IF YOU HAVE ANY QUESTIONS OR PROBLEMS PRIOR TO YOUR ARRIVAL.  REMEMBER: DO NOT EAT OR DRINK AFTER MIDNIGHT THE NIGHT BEFORE YOUR SURGERY    SPECIAL INSTRUCTIONS: FOLLOW HIBICLENS GUIDELINES   YOU MAY BRUSH YOUR TEETH THE MORNING OF SURGERY   TAKE THESE MEDICINES THE DAY OF SURGERY WITH SIP OF WATER: none   DO NOT WEAR JEWELRY, EYE MAKEUP, LIPSTICK OR DARK FINGERNAIL POLISH DO NOT WEAR LOTIONS  DO NOT SHAVE FOR 48 HOURS PRIOR TO SURGERY   YOU WILL NOT BE ALLOWED TO DRIVE YOURSELF HOME.

## 2011-09-01 LAB — MRSA CULTURE

## 2011-09-03 ENCOUNTER — Encounter (HOSPITAL_COMMUNITY): Payer: Self-pay | Admitting: Anesthesiology

## 2011-09-03 ENCOUNTER — Inpatient Hospital Stay (HOSPITAL_COMMUNITY)
Admission: RE | Admit: 2011-09-03 | Discharge: 2011-09-06 | DRG: 766 | Disposition: A | Discharge status: Home / Self Care | Payer: Medicaid Other | Source: Ambulatory Visit | Attending: Obstetrics & Gynecology | Admitting: Obstetrics & Gynecology

## 2011-09-03 ENCOUNTER — Encounter (HOSPITAL_COMMUNITY): Payer: Self-pay

## 2011-09-03 ENCOUNTER — Encounter (HOSPITAL_COMMUNITY)
Admission: RE | Disposition: A | Discharge status: Home / Self Care | Payer: Self-pay | Source: Ambulatory Visit | Attending: Obstetrics & Gynecology

## 2011-09-03 ENCOUNTER — Inpatient Hospital Stay (HOSPITAL_COMMUNITY): Payer: Medicaid Other | Admitting: Anesthesiology

## 2011-09-03 DIAGNOSIS — O34219 Maternal care for unspecified type scar from previous cesarean delivery: Principal | ICD-10-CM | POA: Diagnosis present

## 2011-09-03 DIAGNOSIS — Z22330 Carrier of Group B streptococcus: Secondary | ICD-10-CM

## 2011-09-03 DIAGNOSIS — Z302 Encounter for sterilization: Secondary | ICD-10-CM

## 2011-09-03 DIAGNOSIS — Z01818 Encounter for other preprocedural examination: Secondary | ICD-10-CM

## 2011-09-03 DIAGNOSIS — O99892 Other specified diseases and conditions complicating childbirth: Secondary | ICD-10-CM | POA: Diagnosis present

## 2011-09-03 DIAGNOSIS — O9989 Other specified diseases and conditions complicating pregnancy, childbirth and the puerperium: Secondary | ICD-10-CM

## 2011-09-03 DIAGNOSIS — Z98891 History of uterine scar from previous surgery: Secondary | ICD-10-CM

## 2011-09-03 DIAGNOSIS — Z01812 Encounter for preprocedural laboratory examination: Secondary | ICD-10-CM

## 2011-09-03 LAB — TYPE AND SCREEN
ABO/RH(D): O POS
Antibody Screen: NEGATIVE

## 2011-09-03 SURGERY — Surgical Case
Anesthesia: Spinal | Site: Abdomen | Wound class: Clean Contaminated

## 2011-09-03 MED ORDER — MENTHOL 3 MG MT LOZG: 1.0000 | LOZENGE | OROMUCOSAL | Status: DC | PRN

## 2011-09-03 MED ORDER — DIPHENHYDRAMINE HCL 50 MG/ML IJ SOLN: 12.5000 mg | INTRAMUSCULAR | Status: DC | PRN

## 2011-09-03 MED ORDER — NALBUPHINE HCL 10 MG/ML IJ SOLN
5.0000 mg | INTRAMUSCULAR | Status: DC | PRN
  Filled 2011-09-03: qty 1

## 2011-09-03 MED ORDER — OXYTOCIN 40 UNITS IN LACTATED RINGERS INFUSION - SIMPLE MED: 62.5000 mL/h | INTRAVENOUS | Status: AC

## 2011-09-03 MED ORDER — PHENYLEPHRINE 40 MCG/ML (10ML) SYRINGE FOR IV PUSH (FOR BLOOD PRESSURE SUPPORT)
PREFILLED_SYRINGE | INTRAVENOUS | Status: AC
  Filled 2011-09-03: qty 5

## 2011-09-03 MED ORDER — DIPHENHYDRAMINE HCL 50 MG/ML IJ SOLN: 25.0000 mg | INTRAMUSCULAR | Status: DC | PRN

## 2011-09-03 MED ORDER — OXYCODONE-ACETAMINOPHEN 5-325 MG PO TABS
1.0000 | ORAL_TABLET | ORAL | Status: DC | PRN
  Administered 2011-09-03 – 2011-09-04 (×2): 2 via ORAL
  Administered 2011-09-04: 1 via ORAL
  Administered 2011-09-04 (×2): 2 via ORAL
  Administered 2011-09-04: 1 via ORAL
  Administered 2011-09-05 (×2): 2 via ORAL
  Administered 2011-09-05: 1 via ORAL
  Administered 2011-09-06: 2 via ORAL
  Filled 2011-09-03 (×3): qty 2
  Filled 2011-09-03: qty 1
  Filled 2011-09-03: qty 2
  Filled 2011-09-03: qty 1
  Filled 2011-09-03 (×4): qty 2
  Filled 2011-09-03: qty 1

## 2011-09-03 MED ORDER — KETOROLAC TROMETHAMINE 60 MG/2ML IM SOLN
INTRAMUSCULAR | Status: AC
  Administered 2011-09-03: 60 mg via INTRAMUSCULAR
  Filled 2011-09-03: qty 2

## 2011-09-03 MED ORDER — SCOPOLAMINE 1 MG/3DAYS TD PT72
1.0000 | MEDICATED_PATCH | Freq: Once | TRANSDERMAL | Status: DC
  Administered 2011-09-03: 1.5 mg via TRANSDERMAL

## 2011-09-03 MED ORDER — NALOXONE HCL 0.4 MG/ML IJ SOLN: 0.4000 mg | INTRAMUSCULAR | Status: DC | PRN

## 2011-09-03 MED ORDER — ONDANSETRON HCL 4 MG/2ML IJ SOLN
INTRAMUSCULAR | Status: DC | PRN
  Administered 2011-09-03: 4 mg via INTRAVENOUS

## 2011-09-03 MED ORDER — IBUPROFEN 600 MG PO TABS
600.0000 mg | ORAL_TABLET | Freq: Four times a day (QID) | ORAL | Status: DC
Start: 2011-09-03 — End: 2011-09-06
  Administered 2011-09-03 – 2011-09-06 (×11): 600 mg via ORAL
  Filled 2011-09-03 (×9): qty 1

## 2011-09-03 MED ORDER — LACTATED RINGERS IV SOLN
INTRAVENOUS | Status: DC
  Administered 2011-09-03: 125 mL/h via INTRAVENOUS
  Administered 2011-09-03: 09:00:00 via INTRAVENOUS

## 2011-09-03 MED ORDER — ONDANSETRON HCL 4 MG PO TABS: 4.0000 mg | ORAL_TABLET | ORAL | Status: DC | PRN

## 2011-09-03 MED ORDER — EPHEDRINE 5 MG/ML INJ
INTRAVENOUS | Status: AC
  Filled 2011-09-03: qty 10

## 2011-09-03 MED ORDER — SCOPOLAMINE 1 MG/3DAYS TD PT72
MEDICATED_PATCH | TRANSDERMAL | Status: AC
  Filled 2011-09-03: qty 1

## 2011-09-03 MED ORDER — CEFAZOLIN SODIUM-DEXTROSE 2-3 GM-% IV SOLR
2.0000 g | INTRAVENOUS | Status: AC
  Administered 2011-09-03: 2 g via INTRAVENOUS

## 2011-09-03 MED ORDER — LACTATED RINGERS IV SOLN
INTRAVENOUS | Status: DC | PRN
  Administered 2011-09-03: 10:00:00 via INTRAVENOUS

## 2011-09-03 MED ORDER — SIMETHICONE 80 MG PO CHEW: 80.0000 mg | CHEWABLE_TABLET | ORAL | Status: DC | PRN

## 2011-09-03 MED ORDER — ONDANSETRON HCL 4 MG/2ML IJ SOLN: 4.0000 mg | Freq: Three times a day (TID) | INTRAMUSCULAR | Status: DC | PRN

## 2011-09-03 MED ORDER — DIBUCAINE 1 % RE OINT: 1.0000 "application " | TOPICAL_OINTMENT | RECTAL | Status: DC | PRN

## 2011-09-03 MED ORDER — NALBUPHINE HCL 10 MG/ML IJ SOLN
5.0000 mg | INTRAMUSCULAR | Status: DC | PRN
  Administered 2011-09-03: 10 mg via SUBCUTANEOUS
  Filled 2011-09-03 (×2): qty 1

## 2011-09-03 MED ORDER — KETOROLAC TROMETHAMINE 60 MG/2ML IM SOLN
60.0000 mg | Freq: Once | INTRAMUSCULAR | Status: AC | PRN
  Administered 2011-09-03: 60 mg via INTRAMUSCULAR

## 2011-09-03 MED ORDER — SODIUM CHLORIDE 0.9 % IJ SOLN: 3.0000 mL | INTRAMUSCULAR | Status: DC | PRN

## 2011-09-03 MED ORDER — ZOLPIDEM TARTRATE 5 MG PO TABS: 5.0000 mg | ORAL_TABLET | Freq: Every evening | ORAL | Status: DC | PRN

## 2011-09-03 MED ORDER — MEPERIDINE HCL 25 MG/ML IJ SOLN: 6.2500 mg | INTRAMUSCULAR | Status: DC | PRN

## 2011-09-03 MED ORDER — LANOLIN HYDROUS EX OINT: 1.0000 "application " | TOPICAL_OINTMENT | CUTANEOUS | Status: DC | PRN

## 2011-09-03 MED ORDER — KETOROLAC TROMETHAMINE 30 MG/ML IJ SOLN: 30.0000 mg | Freq: Four times a day (QID) | INTRAMUSCULAR | Status: AC | PRN

## 2011-09-03 MED ORDER — SCOPOLAMINE 1 MG/3DAYS TD PT72
1.0000 | MEDICATED_PATCH | Freq: Once | TRANSDERMAL | Status: DC
  Filled 2011-09-03: qty 1

## 2011-09-03 MED ORDER — KETOROLAC TROMETHAMINE 30 MG/ML IJ SOLN
30.0000 mg | Freq: Four times a day (QID) | INTRAMUSCULAR | Status: AC | PRN
  Administered 2011-09-03: 30 mg via INTRAVENOUS
  Filled 2011-09-03: qty 1

## 2011-09-03 MED ORDER — ONDANSETRON HCL 4 MG/2ML IJ SOLN
INTRAMUSCULAR | Status: AC
  Filled 2011-09-03: qty 2

## 2011-09-03 MED ORDER — HYDROMORPHONE HCL PF 1 MG/ML IJ SOLN: 0.2500 mg | INTRAMUSCULAR | Status: DC | PRN

## 2011-09-03 MED ORDER — ONDANSETRON HCL 4 MG/2ML IJ SOLN
4.0000 mg | INTRAMUSCULAR | Status: DC | PRN
Start: 2011-09-03 — End: 2011-09-06

## 2011-09-03 MED ORDER — METOCLOPRAMIDE HCL 5 MG/ML IJ SOLN: 10.0000 mg | Freq: Three times a day (TID) | INTRAMUSCULAR | Status: DC | PRN

## 2011-09-03 MED ORDER — SIMETHICONE 80 MG PO CHEW
80.0000 mg | CHEWABLE_TABLET | Freq: Three times a day (TID) | ORAL | Status: DC
  Administered 2011-09-03 – 2011-09-06 (×11): 80 mg via ORAL

## 2011-09-03 MED ORDER — OXYTOCIN 10 UNIT/ML IJ SOLN
INTRAMUSCULAR | Status: AC
  Filled 2011-09-03: qty 4

## 2011-09-03 MED ORDER — PRENATAL MULTIVITAMIN CH
1.0000 | ORAL_TABLET | Freq: Every day | ORAL | Status: DC
  Administered 2011-09-03 – 2011-09-06 (×4): 1 via ORAL
  Filled 2011-09-03 (×4): qty 1

## 2011-09-03 MED ORDER — FENTANYL CITRATE 0.05 MG/ML IJ SOLN
INTRAMUSCULAR | Status: AC
  Filled 2011-09-03: qty 2

## 2011-09-03 MED ORDER — SODIUM CHLORIDE 0.9 % IV SOLN
1.0000 ug/kg/h | INTRAVENOUS | Status: DC | PRN
  Filled 2011-09-03: qty 2.5

## 2011-09-03 MED ORDER — MORPHINE SULFATE 0.5 MG/ML IJ SOLN
INTRAMUSCULAR | Status: AC
  Filled 2011-09-03: qty 10

## 2011-09-03 MED ORDER — IBUPROFEN 600 MG PO TABS
600.0000 mg | ORAL_TABLET | Freq: Four times a day (QID) | ORAL | Status: DC | PRN
  Filled 2011-09-03 (×2): qty 1

## 2011-09-03 MED ORDER — TETANUS-DIPHTH-ACELL PERTUSSIS 5-2.5-18.5 LF-MCG/0.5 IM SUSP
0.5000 mL | Freq: Once | INTRAMUSCULAR | Status: AC
  Administered 2011-09-04: 0.5 mL via INTRAMUSCULAR
  Filled 2011-09-03: qty 0.5

## 2011-09-03 MED ORDER — DIPHENHYDRAMINE HCL 25 MG PO CAPS
25.0000 mg | ORAL_CAPSULE | ORAL | Status: DC | PRN
  Administered 2011-09-03: 25 mg via ORAL
  Filled 2011-09-03: qty 1

## 2011-09-03 MED ORDER — WITCH HAZEL-GLYCERIN EX PADS: 1.0000 "application " | MEDICATED_PAD | CUTANEOUS | Status: DC | PRN

## 2011-09-03 MED ORDER — OXYTOCIN 40 UNITS IN LACTATED RINGERS INFUSION - SIMPLE MED
INTRAVENOUS | Status: DC | PRN
  Administered 2011-09-03: 40 [IU] via INTRAVENOUS

## 2011-09-03 MED ORDER — CEFAZOLIN SODIUM-DEXTROSE 2-3 GM-% IV SOLR
INTRAVENOUS | Status: AC
  Filled 2011-09-03: qty 50

## 2011-09-03 MED ORDER — DIPHENHYDRAMINE HCL 25 MG PO CAPS: 25.0000 mg | ORAL_CAPSULE | Freq: Four times a day (QID) | ORAL | Status: DC | PRN

## 2011-09-03 MED ORDER — LACTATED RINGERS IV SOLN
INTRAVENOUS | Status: DC
  Administered 2011-09-03 – 2011-09-04 (×3): via INTRAVENOUS

## 2011-09-03 MED ORDER — SENNOSIDES-DOCUSATE SODIUM 8.6-50 MG PO TABS
2.0000 | ORAL_TABLET | Freq: Every day | ORAL | Status: DC
  Administered 2011-09-03 – 2011-09-05 (×3): 2 via ORAL

## 2011-09-03 MED ORDER — PHENYLEPHRINE HCL 10 MG/ML IJ SOLN
INTRAMUSCULAR | Status: DC | PRN
  Administered 2011-09-03: 80 ug via INTRAVENOUS

## 2011-09-03 SURGICAL SUPPLY — 29 items
BARRIER ADHS 3X4 INTERCEED (GAUZE/BANDAGES/DRESSINGS) IMPLANT
BRR ADH 4X3 ABS CNTRL BYND (GAUZE/BANDAGES/DRESSINGS)
CHLORAPREP W/TINT 26ML (MISCELLANEOUS) ×3 IMPLANT
CLIP FILSHIE TUBAL LIGA STRL (Clip) ×3 IMPLANT
CLOTH BEACON ORANGE TIMEOUT ST (SAFETY) ×3 IMPLANT
DRSG COVADERM 4X10 (GAUZE/BANDAGES/DRESSINGS) IMPLANT
ELECT REM PT RETURN 9FT ADLT (ELECTROSURGICAL) ×3
ELECTRODE REM PT RTRN 9FT ADLT (ELECTROSURGICAL) ×2 IMPLANT
EXTRACTOR VACUUM KIWI (MISCELLANEOUS) IMPLANT
GLOVE BIO SURGEON STRL SZ 6.5 (GLOVE) ×3 IMPLANT
GLOVE BIOGEL PI IND STRL 7.0 (GLOVE) ×4 IMPLANT
GLOVE BIOGEL PI INDICATOR 7.0 (GLOVE) ×2
GOWN PREVENTION PLUS LG XLONG (DISPOSABLE) ×9 IMPLANT
KIT ABG SYR 3ML LUER SLIP (SYRINGE) IMPLANT
NEEDLE HYPO 25X5/8 SAFETYGLIDE (NEEDLE) IMPLANT
NS IRRIG 1000ML POUR BTL (IV SOLUTION) ×3 IMPLANT
PACK C SECTION WH (CUSTOM PROCEDURE TRAY) ×3 IMPLANT
PAD OB MATERNITY 4.3X12.25 (PERSONAL CARE ITEMS) IMPLANT
RETRACTOR WND ALEXIS 25 LRG (MISCELLANEOUS) ×2 IMPLANT
RTRCTR WOUND ALEXIS 25CM LRG (MISCELLANEOUS) ×3
SLEEVE SCD COMPRESS KNEE LRG (MISCELLANEOUS) IMPLANT
SLEEVE SCD COMPRESS KNEE MED (MISCELLANEOUS) ×3 IMPLANT
SUT VIC AB 0 CT1 36 (SUTURE) ×18 IMPLANT
SUT VIC AB 2-0 CT1 27 (SUTURE) ×3
SUT VIC AB 2-0 CT1 TAPERPNT 27 (SUTURE) ×2 IMPLANT
SUT VIC AB 4-0 PS2 27 (SUTURE) ×3 IMPLANT
TOWEL OR 17X24 6PK STRL BLUE (TOWEL DISPOSABLE) ×6 IMPLANT
TRAY FOLEY CATH 14FR (SET/KITS/TRAYS/PACK) IMPLANT
WATER STERILE IRR 1000ML POUR (IV SOLUTION) IMPLANT

## 2011-09-03 NOTE — Anesthesia Procedure Notes (Signed)

## 2011-09-03 NOTE — Transfer of Care (Signed)
Immediate Anesthesia Transfer of Care Note  Patient: [name]  Procedure(s) Performed: Procedure(s) (LRB): CESAREAN SECTION WITH BILATERAL TUBAL LIGATION ()  Patient Location: PACU  Anesthesia Type: Spinal  Level of Consciousness: awake, alert  and oriented  Airway & Oxygen Therapy: Patient Spontanous Breathing  Post-op Assessment: Report given to PACU RN and Post -op Vital signs reviewed and stable  Post vital signs: Reviewed and stable  Complications: No apparent anesthesia complications

## 2011-09-03 NOTE — H&P (Addendum)
[name] is a 26 y.o. female presenting for repeat c-section and tubal ligation.  Maternal Medical History:  Reason for admission: Reason for Admission:   nauseaRLTCS  Contractions: Frequency: irregular.   Duration is approximately 2 minutes.   Perceived severity is mild.    Fetal activity: Perceived fetal activity is normal.   Last perceived fetal movement was within the past hour.    Prenatal complications: no prenatal complications Prenatal Complications - Diabetes: none.    OB History    Grav Para Term Preterm Abortions TAB SAB Ect Mult Living   6 4 4  0 1 1 0 0 0 4     Past Medical History  Diagnosis Date  . Gonorrhea   . Headache     H/O migraines  . Pregnancy induced hypertension     readm 1 wk after 4th delivery   Past Surgical History  Procedure Date  . Cesarean section   . Induced abortion    Family History: family history is negative for Anesthesia problems. Social History:  reports that she has quit smoking. She has never used smokeless tobacco. She reports that she does not drink alcohol or use illicit drugs.   Prenatal Transfer Tool  Maternal Diabetes: No Genetic Screening: Declined - presented too late Maternal Ultrasounds/Referrals: Normal Fetal Ultrasounds or other Referrals:  None Maternal Substance Abuse:  No Significant Maternal Medications:  None Significant Maternal Lab Results:  Lab values include: Group B Strep positive Other Comments:  None  Review of Systems  Constitutional: Negative for fever and chills.  Eyes: Negative for blurred vision and double vision.  Respiratory: Negative for shortness of breath.   Cardiovascular: Negative for chest pain.  Gastrointestinal: Negative for nausea, vomiting and abdominal pain.  Neurological: Negative for dizziness and headaches.      Blood pressure 113/68, pulse 88, temperature 98.4 F (36.9 C), temperature source Oral, resp. rate 16, height 5\' 5"  (1.651 m), weight 81.194 kg (179 lb),  last menstrual period 12/04/2010, SpO2 98.00%. Maternal Exam:  Uterine Assessment: Contraction strength is mild.  Contraction frequency is irregular.   Abdomen: Surgical scars: low transverse.   Fundal height is appropriate for gestational age.   Fetal presentation: vertex     Physical Exam  Constitutional: She is oriented to person, place, and time. She appears well-developed and well-nourished. No distress.  HENT:  Head: Normocephalic and atraumatic.  Eyes: Conjunctivae and EOM are normal.  Neck: Normal range of motion.  Cardiovascular: Normal rate, regular rhythm and normal heart sounds.   Respiratory: Effort normal and breath sounds normal.  GI: Soft. There is no tenderness. There is no rebound and no guarding.  Musculoskeletal: She exhibits no edema and no tenderness.  Neurological: She is alert and oriented to person, place, and time.  Skin: Skin is warm and dry.  Psychiatric: She has a normal mood and affect.    Prenatal labs: ABO, Rh: --/--/O POS (08/31 0750) Antibody: PENDING (08/31 0750) Rubella: Immune (07/24 0000) RPR: NON REACTIVE (08/26 1011)  HBsAg: Negative (07/24 0000)  HIV: Non-reactive (08/26 0000)  GBS:   positive  Assessment/Plan: 26 y.o. [id number] at [redacted]w[redacted]d with 3 prior c-sections Repeat c-section and tubal ligation. Late prenatal care. Dating based on LMP c/w 33 wk sono.   Napoleon Form 09/03/2011, 8:35 AM  Agree with note. Patient counseled regarding risks of surgery, anesthesia, bleeding, infection, pain, abdominal organ damage, failure of sterilization including ectopic pregnancy and her questions were answered.  Amit Leece 09/03/2011 8:56 AM

## 2011-09-03 NOTE — Anesthesia Preprocedure Evaluation (Addendum)
Anesthesia Evaluation  Patient identified by MRN, date of birth, ID band Patient awake    Reviewed: Allergy & Precautions, H&P , Patient's Chart, lab work & pertinent test results  Airway Mallampati: II TM Distance: >3 FB Neck ROM: full    Dental No notable dental hx.    Pulmonary  breath sounds clear to auscultation  Pulmonary exam normal       Cardiovascular Exercise Tolerance: Good hypertension (PIH in past; none presently), Rhythm:regular Rate:Normal     Neuro/Psych    GI/Hepatic   Endo/Other    Renal/GU      Musculoskeletal   Abdominal   Peds  Hematology   Anesthesia Other Findings   Reproductive/Obstetrics                         Anesthesia Physical Anesthesia Plan  ASA: II  Anesthesia Plan: Spinal   Post-op Pain Management:    Induction:   Airway Management Planned:   Additional Equipment:   Intra-op Plan:   Post-operative Plan:   Informed Consent: I have reviewed the patients History and Physical, chart, labs and discussed the procedure including the risks, benefits and alternatives for the proposed anesthesia with the patient or authorized representative who has indicated his/her understanding and acceptance.   Dental Advisory Given  Plan Discussed with: CRNA  Anesthesia Plan Comments: (Lab work confirmed with CRNA in room. Platelets okay. Discussed spinal anesthetic, and patient consents to the procedure:  included risk of possible headache,backache, failed block, allergic reaction, and nerve injury. This patient was asked if she had any questions or concerns before the procedure started. )        Anesthesia Quick Evaluation

## 2011-09-03 NOTE — Op Note (Addendum)
[name]  PROCEDURE DATE: 09/03/2011  PREOPERATIVE DIAGNOSIS: Intrauterine pregnancy at [redacted]w[redacted]d gestation; previous LTCS x 3, undesired fertility  POSTOPERATIVE DIAGNOSIS: The same  PROCEDURE: Repeat Low Transverse Cesarean Section, Bilateral Tubal Sterilization using Filshie clips  SURGEON:  Dr. Scheryl Darter  ASSISTANT:  Napoleon Form, MD  ANESTHESIOLOGIST: Cristela Blue, MD, Isabella Bowens. CRNA  INDICATIONS: [name] is a 26 y.o. [id number] at [redacted]w[redacted]d here for cesarean section and bilateral tubal sterilization secondary to the indications listed under preoperative diagnosis; please see preoperative note for further details.  The risks ofsurgery were discussed with the patient including but were not limited to: bleeding which may require transfusion or reoperation; infection which may require antibiotics; injury to bowel, bladder, ureters or other surrounding organs; injury to the fetus; need for additional procedures including hysterectomy in the event of a life-threatening hemorrhage; placental abnormalities wth subsequent pregnancies, incisional problems, thromboembolic phenomenon and other postoperative/anesthesia complications.  Patient also desires permanent sterilization.  Other reversible forms of contraception were discussed with patient; she declines all other modalities. Risks of procedure discussed with patient including but not limited to: risk of regret, permanence of method, bleeding, infection, injury to surrounding organs and need for additional procedures.  Failure risk of 0.5-1% with increased risk of ectopic gestation if pregnancy occurs was also discussed with patient.  The patient concurred with the proposed plan, giving informed written consent for the procedures.    FINDINGS:  Viable female infant in cephalic presentation, ROA position.  APGARs 9 and 9, weight pending. Clear amniotic fluid.  Intact placenta, three vessel cord.  Uterus with very thin lower segment,  fallopian tubes and ovaries bilaterally.  ANESTHESIA: Spinal INTRAVENOUS FLUIDS: 3100 ml LR ESTIMATED BLOOD LOSS: 700 ml URINE OUTPUT:  100 ml SPECIMENS: Placenta sent to L&D COMPLICATIONS: None immediate  PROCEDURE IN DETAIL:  The patient preoperatively received intravenous antibiotics and had sequential compression devices applied to her lower extremities.   She was then taken to the operating room where spinal anesthesia was administered and was found to be adequate. She was then placed in a dorsal supine position with a leftward tilt, and prepped and draped in a sterile manner.  A foley catheter was placed into her bladder and attached to constant gravity.  After an adequate timeout was performed, a Pfannenstiel skin incision was made with scalpel and carried through to the underlying layer of fascia. The fascia was incised in the midline, and this incision was extended bilaterally using the Mayo scissors.  Kocher clamps were applied to the superior aspect of the fascial incision and the underlying rectus muscles were dissected off bluntly and sharply. A similar process was carried out on the inferior aspect of the fascial incision. The rectus muscles were found to be already separated. Significant scar tissue was encountered requiring sharp and blunt dissection and cautery to enter the peritoneum. Alexis-O ring was insertec. Attention was turned to the lower uterine segment, which was extremely thin. A low transverse hysterotomy was made with a scalpel and extended bilaterally bluntly. The infant was successfully delivered, the cord was clamped and cut and the infant was handed over to awaiting neonatology team. Uterine massage was then administered, and the placenta delivered intact with a three-vessel cord. The uterus was then cleared of clot and debris.  The hysterotomy was closed with 0 Vicryl in a running locked fashion, and an imbricating layer was also placed with a 0 Vicryl suture. Attention  was then turned to the fallopian tubes.  A  Filshie clip was placed on both tubes, about 3 cm from the cornua, with care given to incorporate the underlying mesosalpinx on both sides, allowing for bilateral tubal sterilization. The pelvis was cleared of all clot and debris. Hemostasis was confirmed on all surfaces.  The peritoneum was reapproximated using 2-0 Vicryl in running fashion. The fascia was then closed using 0 Vicryl in a running fashion.  The subcutaneous layer was irrigated. The skin was closed with a 4-0 Vicryl subcuticular stitch. Pressure dressing was applied. The patient tolerated the procedure well. Sponge, lap, instrument and needle counts were correct x 2.  She was taken to the recovery room in stable condition.   Napoleon Form, MD

## 2011-09-03 NOTE — OR Nursing (Signed)
Placenta to or utility

## 2011-09-03 NOTE — Addendum Note (Signed)
Addendum  created 09/03/11 1810 by Len Blalock, CRNA   Modules edited:Notes Section

## 2011-09-03 NOTE — Anesthesia Postprocedure Evaluation (Signed)
  Anesthesia Post-op Note  Patient: [name]  Procedure(s) Performed: Procedure(s) (LRB): CESAREAN SECTION WITH BILATERAL TUBAL LIGATION ()  Patient Location: PACU and Mother/Baby  Anesthesia Type: Spinal  Level of Consciousness: awake, alert  and oriented  Airway and Oxygen Therapy: Patient Spontanous Breathing   Post-op Assessment: Patient's Cardiovascular Status Stable and Respiratory Function Stable  Post-op Vital Signs: stable  Complications: No apparent anesthesia complications

## 2011-09-03 NOTE — Anesthesia Postprocedure Evaluation (Signed)
  Anesthesia Post-op Note  Patient: [name]  Procedure(s) Performed: Procedure(s) (LRB): CESAREAN SECTION WITH BILATERAL TUBAL LIGATION ()   Patient is awake, responsive, moving her legs, and has signs of resolution of her numbness. Pain and nausea are reasonably well controlled. Vital signs are stable and clinically acceptable. Oxygen saturation is clinically acceptable. There are no apparent anesthetic complications at this time. Patient is ready for discharge.

## 2011-09-04 LAB — CBC
Platelets: 199 10*3/uL (ref 150–400)
RBC: 3.57 MIL/uL — ABNORMAL LOW (ref 3.87–5.11)
RDW: 14.7 % (ref 11.5–15.5)
WBC: 13.8 10*3/uL — ABNORMAL HIGH (ref 4.0–10.5)

## 2011-09-04 NOTE — Progress Notes (Signed)
Subjective: Postpartum Day 1: Cesarean Delivery Patient reports no problems.  Mild incisional pain controlled by meds.  RN just removed foley cath, hasn't voided yet.  Not yet passing gas.  BF well.  Eating and drinking well.   Objective: Vital signs in last 24 hours: Temp:  [97.5 F (36.4 C)-98.5 F (36.9 C)] 98.5 F (36.9 C) (09/01 0520) Pulse Rate:  [52-88] 53  (09/01 0520) Resp:  [16-22] 18  (09/01 0520) BP: (101-134)/(59-79) 118/71 mmHg (09/01 0520) SpO2:  [97 %-100 %] 99 % (09/01 0520)  Physical Exam:  General: alert, cooperative and no distress Lochia: appropriate Uterine Fundus: firm Incision: covered by dressing- no drainage on dressing DVT Evaluation: No evidence of DVT seen on physical exam. Negative Homan's sign. No cords or calf tenderness. No significant calf/ankle edema.   Basename 09/04/11 0557  HGB 10.9*  HCT 32.2*    Assessment/Plan: Status post Cesarean section. Doing well postoperatively.  Continue current care.  Marge Duncans 09/04/2011, 7:34 AM

## 2011-09-04 NOTE — Progress Notes (Signed)
Spoke with MOB briefly.  No hx of dep, she states two years ago she had high blood pressure that caused some symptoms that looked liked depression.  No current emotional concerns.  Provided feelings after birth brochure.   Patient was referred for history of depression/anxiety. * Referral screened out by Clinical Social Worker because none of the following criteria appear to apply: ~ History of anxiety/depression during this pregnancy, or of post-partum depression. ~ Diagnosis of anxiety and/or depression within last 3 years ~ History of depression due to pregnancy loss/loss of child OR * Patient's symptoms currently being treated with medication and/or therapy. Please contact the Clinical Social Worker if needs arise, or by the patient's request.

## 2011-09-05 NOTE — Progress Notes (Signed)
UR chart review completed.  

## 2011-09-05 NOTE — Progress Notes (Signed)
Post Partum Day 2 Subjective: no complaints, up ad lib, voiding and tolerating PO, small lochia, plans to breastfeed, s/p BTS, pain meds working well  Objective: Blood pressure 116/68, pulse 68, temperature 99 F (37.2 C), temperature source Oral, resp. rate 18, height 5\' 5"  (1.651 m), weight 81.194 kg (179 lb), last menstrual period 12/04/2010, SpO2 97.00%, unknown if currently breastfeeding.  Physical Exam:  General: alert, cooperative and no distress Lochia:normal flow Chest: CTAB Heart: RRR no m/r/g Abdomen: +BS, soft, nontender, low transverse incision c/d/i Uterine Fundus: firm DVT Evaluation: No evidence of DVT seen on physical exam. Extremities: neg edema   Basename 09/04/11 0557  HGB 10.9*  HCT 32.2*    Assessment/Plan: POD#2 RLTCS w/BTS Breastfeeding Plan for discharge tomorrow   LOS: 2 days   [name], [name] 09/05/2011, 7:50 AM   I have seen and examined this patient and agree the above assessment. [name],[name] 09/05/2011 11:04 AM

## 2011-09-06 DIAGNOSIS — Z98891 History of uterine scar from previous surgery: Secondary | ICD-10-CM

## 2011-09-06 MED ORDER — OXYCODONE-ACETAMINOPHEN 5-325 MG PO TABS: 1.0000 | ORAL_TABLET | ORAL | Status: AC | PRN

## 2011-09-06 MED ORDER — SENNOSIDES-DOCUSATE SODIUM 8.6-50 MG PO TABS: 2.0000 | ORAL_TABLET | Freq: Every day | ORAL | Status: AC

## 2011-09-06 NOTE — Discharge Summary (Signed)
Obstetric Discharge Summary  Postoperative Day 3   Reason for Admission: cesarean section Prenatal Procedures: none Intrapartum Procedures: cesarean: low cervical, transverse and tubal ligation Postpartum Procedures: none Complications-Operative and Postpartum: none Hemoglobin  Date Value Range Status  09/04/2011 10.9* 12.0 - 15.0 g/dL Final     HCT  Date Value Range Status  09/04/2011 32.2* 36.0 - 46.0 % Final    Physical Exam:  General: alert, cooperative, appears stated age and no distress CV: RRR, 2+ DP pulses bilaterally PULM: CTAB, nl effort ABD: NABS, soft, nontender, nondistended Lochia: appropriate Uterine Fundus: firm Incision: healing well, no significant drainage, no dehiscence, no significant erythema DVT Evaluation: No evidence of DVT seen on physical exam. No significant calf/ankle edema.  Discharge Diagnoses: Term Pregnancy-delivered  Discharge Information: Date: 09/06/2011 Activity: pelvic rest Diet: routine Medications: PNV, Colace and Percocet Condition: stable Instructions: refer to practice specific booklet Discharge to: home Breastfeeding  Newborn Data: Live born female  Birth Weight: 6 lb 6.3 oz (2900 g) APGAR: 9, 9  Home with mother.  Simone Curia 09/06/2011, 8:05 AM

## 2011-09-06 NOTE — Discharge Summary (Signed)
I saw and examined patient along with student and agree with above note.   [name] 11:12 AM, 09/06/11

## 2011-09-08 ENCOUNTER — Encounter (HOSPITAL_COMMUNITY): Payer: Self-pay | Admitting: *Deleted

## 2013-01-26 IMAGING — US US OB DETAIL+14 WK
1 of 2 series · 12 of 28 positions shown · non-contrast
Comparison: none

[Series 1: us ob detail +14 wk · 83 acquisitions, 12 frames shown]
[im 1/83]
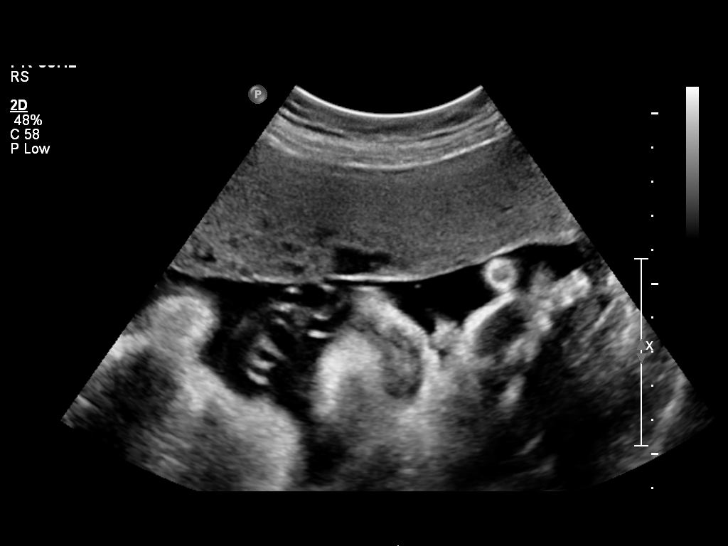
[im 7/83]
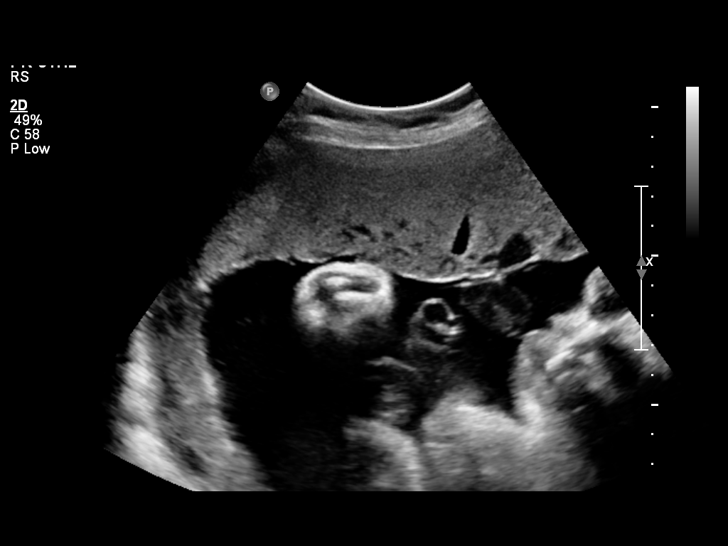
[im 13/83]
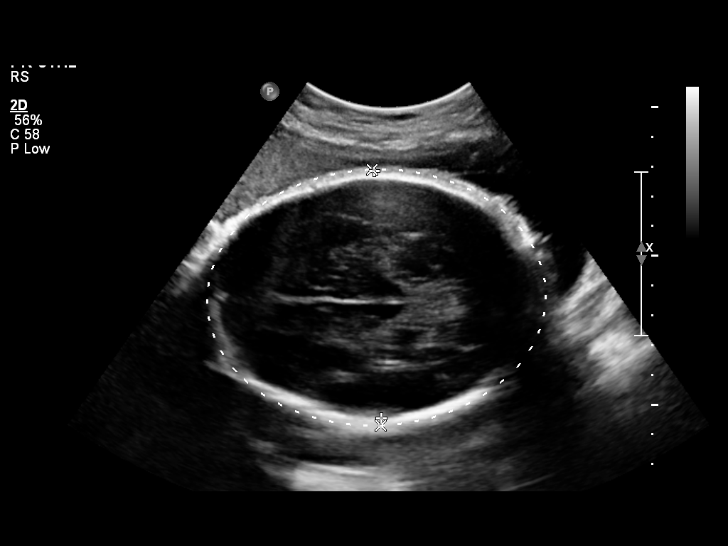
[im 23/83]
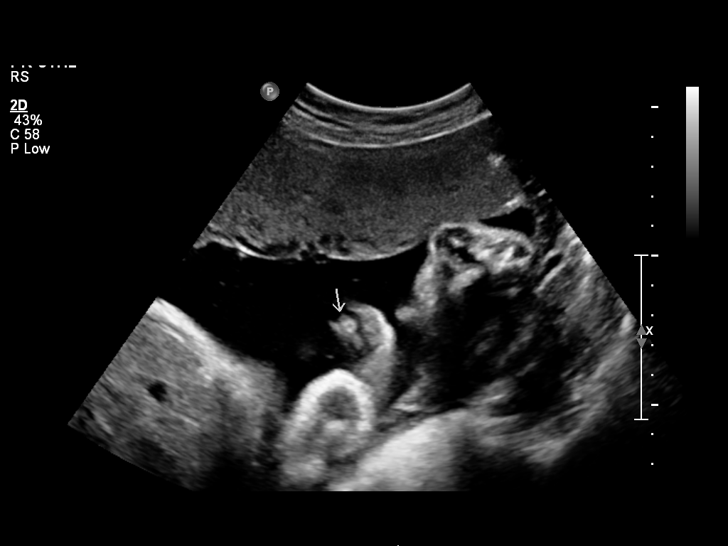
[im 29/83]
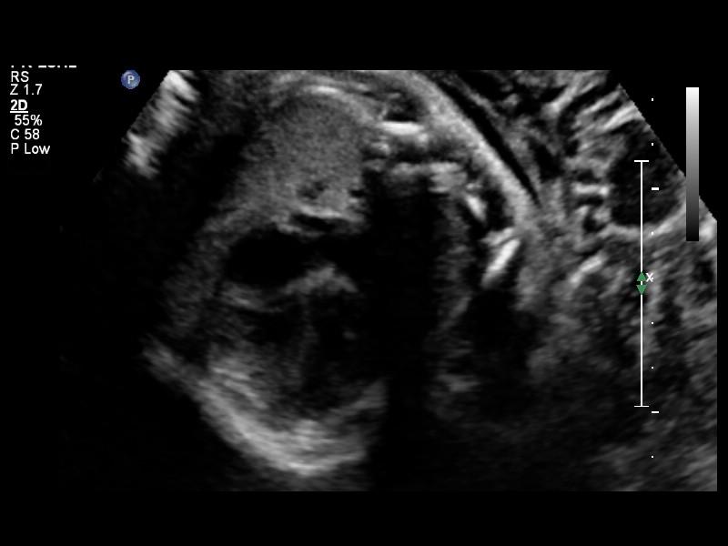
[im 35/83]
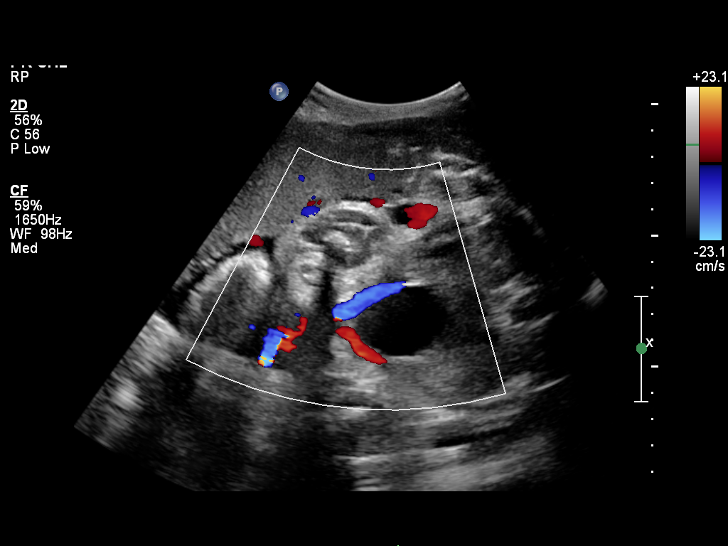
[im 45/83]
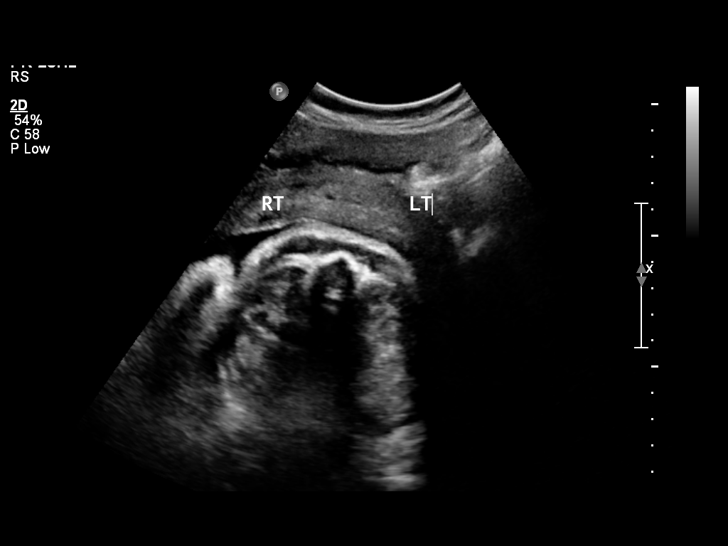
[im 51/83]
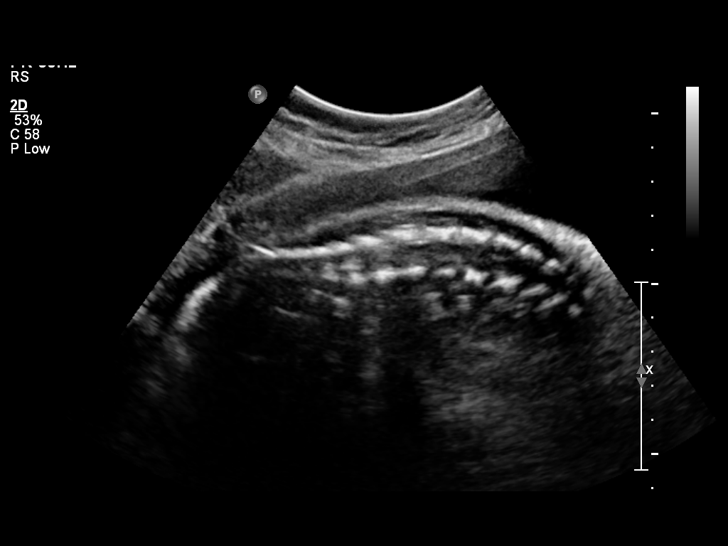
[im 57/83]
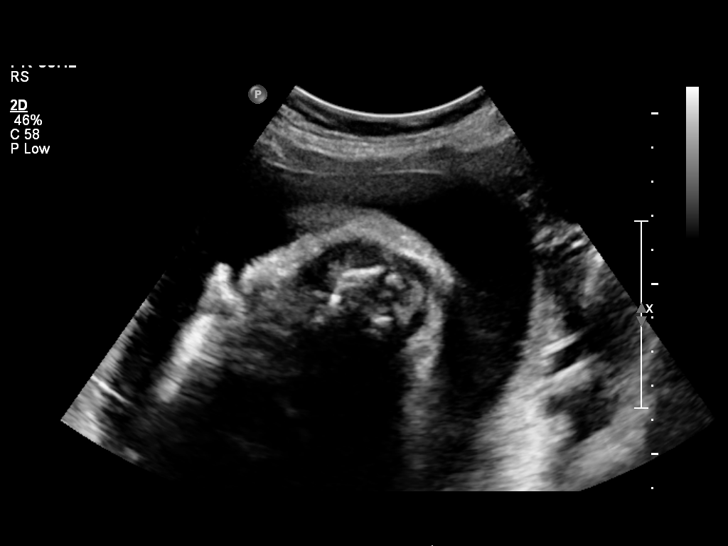
[im 67/83]
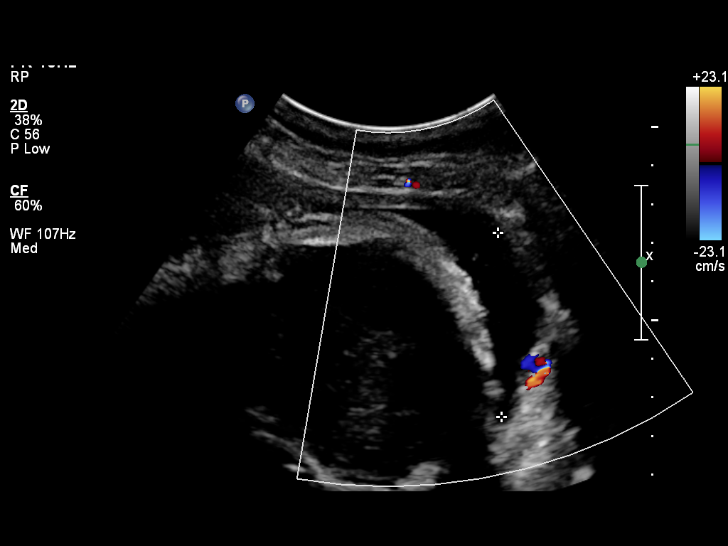
[im 73/83]
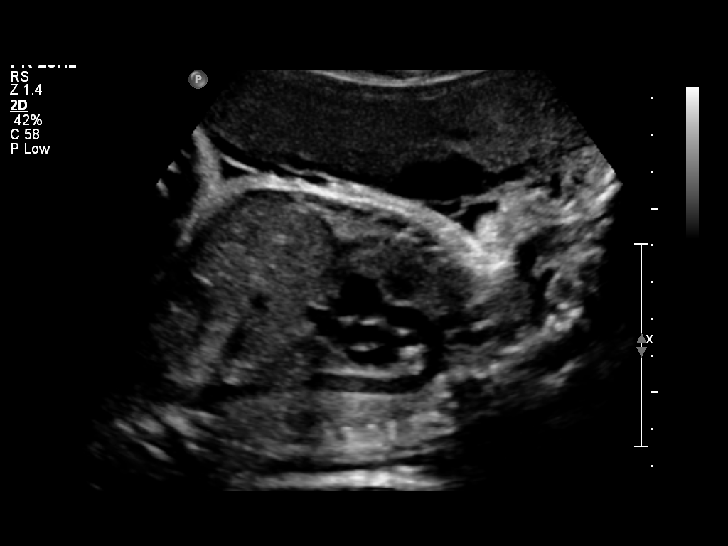
[im 79/83]
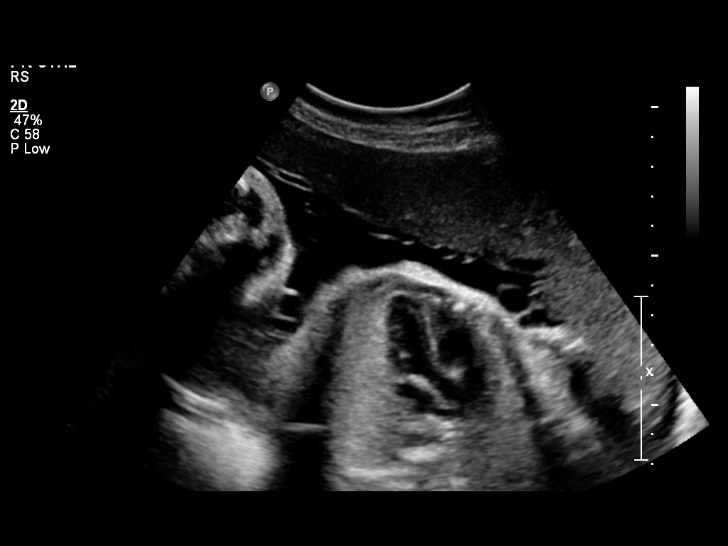

[12 of 28 positions shown; findings below may reference images not displayed]

OBSTETRICS REPORT
                      (Signed Final 07/29/2011 [DATE])

                 PA
 Order#:         [id number]
Procedures

 US OB DETAIL + 14 WK                                  76811.0
Indications

 Detailed fetal anatomic survey
 Poor obstetric history: Previous preeclampsia /
 eclampsia/gestational HTN
 Unsure of LMP;  Establish Gestational [AGE]
Fetal Evaluation

 Preg. Location:    Intrauterine
 Gest. Sac:         Intrauterine
 Fetal Heart Rate:  128                         bpm
 Cardiac Activity:  Observed
 Presentation:      Cephalic
 Placenta:          Anterior, above cervical os
 P. Cord            Visualized
 Insertion:

 Amniotic Fluid
 AFI FV:      Subjectively within normal limits
 AFI Sum:     13.1    cm      42   %Tile     Larg Pckt:   4.74   cm
 RUQ:   2.84   cm    RLQ:    4.04   cm    LUQ:   4.74    cm   LLQ:    1.48   cm
Biometry

 BPD:     82.1  mm    G. Age:   33w 0d                CI:        70.78   70 - 86
                                                      FL/HC:      20.8   19.4 -

 HC:       311  mm    G. Age:   34w 6d       41  %    HC/AC:      1.06   0.96 -

 AC:     292.3  mm    G. Age:   33w 2d       39  %    FL/BPD:     78.8   71 - 87
 FL:      64.7  mm    G. Age:   33w 3d       31  %    FL/AC:      22.1   20 - 24
 HUM:     57.1  mm    G. Age:   33w 1d       46  %

 Est. FW:    8723  gm    4 lb 13 oz      53  %
Gestational Age

 LMP:           33w 6d       Date:   12/04/10                 EDD:   09/10/11
 U/S Today:     33w 5d                                        EDD:   09/11/11
 Best:          33w 5d    Det. By:   U/S (07/29/11)           EDD:   09/11/11
Anatomy

 Cranium:           Appears normal      Aortic Arch:       Appears normal
 Fetal Cavum:       Appears normal      Ductal Arch:       Appears normal
 Ventricles:        Appears normal      Diaphragm:         Appears normal
 Choroid Plexus:    Appears normal      Stomach:           Appears
                                                           normal, left
                                                           sided
 Cerebellum:        Appears normal      Abdomen:           Appears normal
 Posterior Fossa:   Appears normal      Abdominal Wall:    Appears nml
                                                           (cord insert,
                                                           abd wall)
 Nuchal Fold:       Not applicable      Cord Vessels:      Appears normal
                    (>20 wks GA)                           (3 vessel cord)
 Face:              Appears normal      Kidneys:           Appear normal
                    (lips/profile/orbit
                    s)
 Heart:             Appears normal      Bladder:           Appears normal
                    (4 chamber &
                    axis)
 RVOT:              Appears normal      Spine:             Appears normal
 LVOT:              Appears normal      Limbs:             Four extremities
                                                           seen

 Other:     Fetus appears to be a female. 5th digit visualized.
            Technically difficult due to advanced gestational age.
Cervix Uterus Adnexa

 Cervical Length:   3.64      cm

 Cervix:       Closed. Normal appearance by transabdominal scan.
 Uterus:       No abnormality visualized.
 Left Ovary:   No adnexal mass visualized.
 Right Ovary:  Within normal limits.

 Adnexa:     No abnormality visualized.
Impression

 Single intrauterine gestation demonstrating an estimated
 gestational age by ultrasound of 33w 5d  . This correlates
 well with expected EGA by  LMP of 33w 6d but todays exam
 was used as best EGA because of history of unsure LMP .

 No focal fetal or placental abnormalities are noted with a
 good anatomic evaluation possible. No soft markers for Down
 Syndrome are seen. Sonographic modification of Down
 Syndrome risk was not performed as the patient is beyond
 the EGA at which this assessment is typically performed.

 Subjectively and quantitatively normal amniotic fluid volume.
 Normal cervical length and appearance.

## 2013-02-04 ENCOUNTER — Inpatient Hospital Stay (HOSPITAL_COMMUNITY)
Admission: AD | Admit: 2013-02-04 | Discharge: 2013-02-04 | Disposition: A | Discharge status: Home / Self Care | Payer: Medicaid Other | Source: Ambulatory Visit | Attending: Obstetrics & Gynecology | Admitting: Obstetrics & Gynecology

## 2013-02-04 ENCOUNTER — Encounter (HOSPITAL_COMMUNITY): Payer: Self-pay | Admitting: *Deleted

## 2013-02-04 DIAGNOSIS — B373 Candidiasis of vulva and vagina: Secondary | ICD-10-CM

## 2013-02-04 DIAGNOSIS — B9689 Other specified bacterial agents as the cause of diseases classified elsewhere: Secondary | ICD-10-CM | POA: Insufficient documentation

## 2013-02-04 DIAGNOSIS — N760 Acute vaginitis: Secondary | ICD-10-CM | POA: Insufficient documentation

## 2013-02-04 DIAGNOSIS — B3731 Acute candidiasis of vulva and vagina: Secondary | ICD-10-CM | POA: Insufficient documentation

## 2013-02-04 DIAGNOSIS — A499 Bacterial infection, unspecified: Secondary | ICD-10-CM | POA: Insufficient documentation

## 2013-02-04 DIAGNOSIS — Z87891 Personal history of nicotine dependence: Secondary | ICD-10-CM | POA: Insufficient documentation

## 2013-02-04 LAB — URINALYSIS, ROUTINE W REFLEX MICROSCOPIC
Glucose, UA: NEGATIVE mg/dL
HGB URINE DIPSTICK: NEGATIVE
Ketones, ur: NEGATIVE mg/dL
Leukocytes, UA: NEGATIVE
Nitrite: NEGATIVE
PH: 6.5 (ref 5.0–8.0)
Protein, ur: NEGATIVE mg/dL
Urobilinogen, UA: 0.2 mg/dL (ref 0.0–1.0)

## 2013-02-04 LAB — POCT PREGNANCY, URINE: Preg Test, Ur: NEGATIVE

## 2013-02-04 LAB — WET PREP, GENITAL: TRICH WET PREP: NONE SEEN

## 2013-02-04 MED ORDER — FLUCONAZOLE 150 MG PO TABS: 150.0000 mg | ORAL_TABLET | Freq: Every day | ORAL | Status: DC

## 2013-02-04 MED ORDER — METRONIDAZOLE 500 MG PO TABS: 500.0000 mg | ORAL_TABLET | Freq: Two times a day (BID) | ORAL | Status: DC

## 2013-02-04 NOTE — MAU Provider Note (Signed)
Attestation of Attending Supervision of Advanced Practitioner (CNM/NP): Evaluation and management procedures were performed by the Advanced Practitioner under my supervision and collaboration.  I have reviewed the Advanced Practitioner's note and chart, and I agree with the management and plan.  HARRAWAY-SMITH, Lanier Millon 9:38 PM     

## 2013-02-04 NOTE — MAU Provider Note (Signed)
History     CSN: [id number]  Arrival date and time: 02/04/13 1259   First Provider Initiated Contact with Patient 02/04/13 1732      Chief Complaint  Patient presents with  . Vaginal Discharge    with odor  . Vaginal Itching   HPI Comments: [name] 28 y.o. [id number] presenst to MAU with vaginal discharge ongoing since December. She has same sexual partner for last 8 years. No GYN. Had BTL with last pregnancy.   Vaginal Discharge The patient's primary symptoms include a vaginal discharge.  Vaginal Itching The patient's primary symptoms include a vaginal discharge.      Past Medical History  Diagnosis Date  . Gonorrhea   . Headache     H/O migraines  . Pregnancy induced hypertension     readm 1 wk after 4th delivery    Past Surgical History  Procedure Laterality Date  . Cesarean section    . Induced abortion      Family History  Problem Relation Age of Onset  . Anesthesia problems Neg Hx     History  Substance Use Topics  . Smoking status: Former Games developer  . Smokeless tobacco: Never Used     Comment: quit in March 2013  . Alcohol Use: No    Allergies: No Known Allergies  No prescriptions prior to admission    Review of Systems  Constitutional: Negative.   HENT: Negative.   Eyes: Negative.   Respiratory: Negative.   Cardiovascular: Negative.   Gastrointestinal: Negative.   Genitourinary: Positive for vaginal discharge.       Vaginal discharge  Musculoskeletal: Negative.   Skin: Negative.   Neurological: Negative.   Psychiatric/Behavioral: Negative.    Physical Exam   Blood pressure 125/80, pulse 80, temperature 98.3 F (36.8 C), resp. rate 18, height 5\' 5"  (1.651 m), weight 77.111 kg (170 lb), unknown if currently breastfeeding.  Physical Exam  Constitutional: She appears well-developed and well-nourished.  HENT:  Head: Normocephalic and atraumatic.  Eyes: Conjunctivae are normal. Pupils are equal, round, and reactive to light.  GI:  Soft. She exhibits no distension. There is no tenderness. There is no rebound and no guarding.  Genitourinary:  Genital: negative external Vaginal: moderate amount white discharge Cervix: closed Bimanual: negative   Musculoskeletal: Normal range of motion.  Neurological: She is alert.  Skin: Skin is warm and dry.  Psychiatric: She has a normal mood and affect. Her behavior is normal. Judgment and thought content normal.   Results for orders placed during the hospital encounter of 02/04/13 (from the past 24 hour(s))  URINALYSIS, ROUTINE W REFLEX MICROSCOPIC     Status: Abnormal   Collection Time    02/04/13  3:35 PM      Result Value Range   Color, Urine AMBER (*) YELLOW   APPearance CLEAR  CLEAR   Specific Gravity, Urine >1.030 (*) 1.005 - 1.030   pH 6.5  5.0 - 8.0   Glucose, UA NEGATIVE  NEGATIVE mg/dL   Hgb urine dipstick NEGATIVE  NEGATIVE   Bilirubin Urine SMALL (*) NEGATIVE   Ketones, ur NEGATIVE  NEGATIVE mg/dL   Protein, ur NEGATIVE  NEGATIVE mg/dL   Urobilinogen, UA 0.2  0.0 - 1.0 mg/dL   Nitrite NEGATIVE  NEGATIVE   Leukocytes, UA NEGATIVE  NEGATIVE  POCT PREGNANCY, URINE     Status: None   Collection Time    02/04/13  3:49 PM      Result Value Range   Preg Test,  Ur NEGATIVE  NEGATIVE   Results for orders placed during the hospital encounter of 02/04/13 (from the past 24 hour(s))  URINALYSIS, ROUTINE W REFLEX MICROSCOPIC     Status: Abnormal   Collection Time    02/04/13  3:35 PM      Result Value Range   Color, Urine AMBER (*) YELLOW   APPearance CLEAR  CLEAR   Specific Gravity, Urine >1.030 (*) 1.005 - 1.030   pH 6.5  5.0 - 8.0   Glucose, UA NEGATIVE  NEGATIVE mg/dL   Hgb urine dipstick NEGATIVE  NEGATIVE   Bilirubin Urine SMALL (*) NEGATIVE   Ketones, ur NEGATIVE  NEGATIVE mg/dL   Protein, ur NEGATIVE  NEGATIVE mg/dL   Urobilinogen, UA 0.2  0.0 - 1.0 mg/dL   Nitrite NEGATIVE  NEGATIVE   Leukocytes, UA NEGATIVE  NEGATIVE  POCT PREGNANCY, URINE      Status: None   Collection Time    02/04/13  3:49 PM      Result Value Range   Preg Test, Ur NEGATIVE  NEGATIVE  WET PREP, GENITAL     Status: Abnormal   Collection Time    02/04/13  5:37 PM      Result Value Range   Yeast Wet Prep HPF POC MODERATE (*) NONE SEEN   Trich, Wet Prep NONE SEEN  NONE SEEN   Clue Cells Wet Prep HPF POC MODERATE (*) NONE SEEN   WBC, Wet Prep HPF POC FEW (*) NONE SEEN      MAU Course  Procedures  MDM  Wet prep/ GC/ Chlamydia  Assessment and Plan   A: Bacterial vaginosis Yeast infection  P: Flagyl 500 mg PO BID X 7 days Diflucan 150 mg 2 tabs  Needs to see GYN for further evaluations  Carolynn ServeBarefoot, Kenneith Stief Miller 02/04/2013, 5:40 PM

## 2013-02-04 NOTE — MAU Note (Signed)
Has hx of BV with last pregnancy, has 3918 month old, had tubal after that delivery, here with vaginal discharge/odor/itching

## 2013-02-04 NOTE — Discharge Instructions (Signed)
Bacterial Vaginosis Bacterial vaginosis is a vaginal infection that occurs when the normal balance of bacteria in the vagina is disrupted. It results from an overgrowth of certain bacteria. This is the most common vaginal infection in women of childbearing age. Treatment is important to prevent complications, especially in pregnant women, as it can cause a premature delivery. CAUSES  Bacterial vaginosis is caused by an increase in harmful bacteria that are normally present in smaller amounts in the vagina. Several different kinds of bacteria can cause bacterial vaginosis. However, the reason that the condition develops is not fully understood. RISK FACTORS Certain activities or behaviors can put you at an increased risk of developing bacterial vaginosis, including:  Having a new sex partner or multiple sex partners.  Douching.  Using an intrauterine device (IUD) for contraception. Women do not get bacterial vaginosis from toilet seats, bedding, swimming pools, or contact with objects around them. SIGNS AND SYMPTOMS  Some women with bacterial vaginosis have no signs or symptoms. Common symptoms include:  Grey vaginal discharge.  A fishlike odor with discharge, especially after sexual intercourse.  Itching or burning of the vagina and vulva.  Burning or pain with urination. DIAGNOSIS  Your health care provider will take a medical history and examine the vagina for signs of bacterial vaginosis. A sample of vaginal fluid may be taken. Your health care provider will look at this sample under a microscope to check for bacteria and abnormal cells. A vaginal pH test may also be done.  TREATMENT  Bacterial vaginosis may be treated with antibiotic medicines. These may be given in the form of a pill or a vaginal cream. A second round of antibiotics may be prescribed if the condition comes back after treatment.  HOME CARE INSTRUCTIONS   Only take over-the-counter or prescription medicines as  directed by your health care provider.  If antibiotic medicine was prescribed, take it as directed. Make sure you finish it even if you start to feel better.  Do not have sex until treatment is completed.  Tell all sexual partners that you have a vaginal infection. They should see their health care provider and be treated if they have problems, such as a mild rash or itching.  Practice safe sex by using condoms and only having one sex partner. SEEK MEDICAL CARE IF:   Your symptoms are not improving after 3 days of treatment.  You have increased discharge or pain.  You have a fever. MAKE SURE YOU:   Understand these instructions.  Will watch your condition.  Will get help right away if you are not doing well or get worse. FOR MORE INFORMATION  Centers for Disease Control and Prevention, Division of STD Prevention: www.cdc.gov/std American Sexual Health Association (ASHA): www.ashastd.org  Document Released: 12/20/2004 Document Revised: 10/10/2012 Document Reviewed: 08/01/2012 ExitCare Patient Information 2014 [employer]. Monilial Vaginitis Vaginitis in a soreness, swelling and redness (inflammation) of the vagina and vulva. Monilial vaginitis is not a sexually transmitted infection. CAUSES  Yeast vaginitis is caused by yeast (candida) that is normally found in your vagina. With a yeast infection, the candida has overgrown in number to a point that upsets the chemical balance. SYMPTOMS   White, thick vaginal discharge.  Swelling, itching, redness and irritation of the vagina and possibly the lips of the vagina (vulva).  Burning or painful urination.  Painful intercourse. DIAGNOSIS  Things that may contribute to monilial vaginitis are:  Postmenopausal and virginal states.  Pregnancy.  Infections.  Being tired, sick or stressed,   especially if you had monilial vaginitis in the past.  Diabetes. Good control will help lower the chance.  Birth control  pills.  Tight fitting garments.  Using bubble bath, feminine sprays, douches or deodorant tampons.  Taking certain medications that kill germs (antibiotics).  Sporadic recurrence can occur if you become ill. TREATMENT  Your caregiver will give you medication.  There are several kinds of anti monilial vaginal creams and suppositories specific for monilial vaginitis. For recurrent yeast infections, use a suppository or cream in the vagina 2 times a week, or as directed.  Anti-monilial or steroid cream for the itching or irritation of the vulva may also be used. Get your caregiver's permission.  Painting the vagina with methylene blue solution may help if the monilial cream does not work.  Eating yogurt may help prevent monilial vaginitis. HOME CARE INSTRUCTIONS   Finish all medication as prescribed.  Do not have sex until treatment is completed or after your caregiver tells you it is okay.  Take warm sitz baths.  Do not douche.  Do not use tampons, especially scented ones.  Wear cotton underwear.  Avoid tight pants and panty hose.  Tell your sexual partner that you have a yeast infection. They should go to their caregiver if they have symptoms such as mild rash or itching.  Your sexual partner should be treated as well if your infection is difficult to eliminate.  Practice safer sex. Use condoms.  Some vaginal medications cause latex condoms to fail. Vaginal medications that harm condoms are:  Cleocin cream.  Butoconazole (Femstat).  Terconazole (Terazol) vaginal suppository.  Miconazole (Monistat) (may be purchased over the counter). SEEK MEDICAL CARE IF:   You have a temperature by mouth above 102 F (38.9 C).  The infection is getting worse after 2 days of treatment.  The infection is not getting better after 3 days of treatment.  You develop blisters in or around your vagina.  You develop vaginal bleeding, and it is not your menstrual period.  You  have pain when you urinate.  You develop intestinal problems.  You have pain with sexual intercourse. Document Released: 09/29/2004 Document Revised: 03/14/2011 Document Reviewed: 06/13/2008 ExitCare Patient Information 2014 [employer].  

## 2013-02-05 LAB — GC/CHLAMYDIA PROBE AMP
CT PROBE, AMP APTIMA: NEGATIVE
GC Probe RNA: NEGATIVE

## 2013-11-04 ENCOUNTER — Encounter (HOSPITAL_COMMUNITY): Payer: Self-pay | Admitting: *Deleted

## 2015-01-31 ENCOUNTER — Emergency Department (HOSPITAL_COMMUNITY)
Admission: EM | Admit: 2015-01-31 | Discharge: 2015-01-31 | Disposition: A | Discharge status: Home / Self Care | Payer: Self-pay | Attending: Emergency Medicine | Admitting: Emergency Medicine

## 2015-01-31 ENCOUNTER — Encounter (HOSPITAL_COMMUNITY): Payer: Self-pay | Admitting: Emergency Medicine

## 2015-01-31 DIAGNOSIS — Z79899 Other long term (current) drug therapy: Secondary | ICD-10-CM | POA: Insufficient documentation

## 2015-01-31 DIAGNOSIS — Z0289 Encounter for other administrative examinations: Secondary | ICD-10-CM | POA: Insufficient documentation

## 2015-01-31 DIAGNOSIS — Z87891 Personal history of nicotine dependence: Secondary | ICD-10-CM | POA: Insufficient documentation

## 2015-01-31 DIAGNOSIS — Z8619 Personal history of other infectious and parasitic diseases: Secondary | ICD-10-CM | POA: Insufficient documentation

## 2015-01-31 DIAGNOSIS — Z792 Long term (current) use of antibiotics: Secondary | ICD-10-CM | POA: Insufficient documentation

## 2015-01-31 NOTE — ED Provider Notes (Signed)
CSN: [id number]     Arrival date & time 01/31/15  1423 History  By signing my name below, I, Phillis Haggis, attest that this documentation has been prepared under the direction and in the presence of Cheri Fowler, PA-C. Electronically Signed: Phillis Haggis, ED Scribe. 01/31/2015. 2:50 PM.   Chief Complaint  Patient presents with  . Diarrhea  . Emesis  . Work note    The history is provided by the patient. No language interpreter was used.  HPI Comments: [name] is a 30 y.o. female who presents to the Emergency Department requesting a work note. Pt states that she had the "stomach bug" last week after her children had it.  She missed 3 days of work. 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 Pt denies fever, nausea, vomiting, diarrhea, urinary symptoms, or abdominal pain.   Past Medical History  Diagnosis Date  . Gonorrhea   . Headache(784.0)     H/O migraines  . Pregnancy induced hypertension     readm 1 wk after 4th delivery   Past Surgical History  Procedure Laterality Date  . Cesarean section    . Induced abortion     Family History  Problem Relation Age of Onset  . Anesthesia problems Neg Hx    Social History  Substance Use Topics  . Smoking status: Former Smoker    Quit date: 01/05/2011  . Smokeless tobacco: Never Used     Comment: quit in March 2013  . Alcohol Use: No   OB History    Gravida Para Term Preterm AB TAB SAB Ectopic Multiple Living   0 1 1 0 0 0 5     Review of Systems  Constitutional: Negative for fever.  Gastrointestinal: Negative for nausea, vomiting, abdominal pain and diarrhea.  All other systems reviewed and are negative.  Allergies  Review of patient's allergies indicates no known allergies.  Home Medications   Prior to Admission medications   Medication Sig Start Date End Date Taking? Authorizing Provider  fluconazole (DIFLUCAN) 150 MG tablet Take 1 tablet (150 mg total)  by mouth daily. 02/04/13   Delbert Phenix, NP  metroNIDAZOLE (FLAGYL) 500 MG tablet Take 1 tablet (500 mg total) by mouth 2 (two) times daily. 02/04/13   Delbert Phenix, NP   BP 119/74 mmHg  Pulse 75  Temp(Src) 98.2 F (36.8 C) (Oral)  Resp 20  SpO2 98%  LMP 01/14/2015 Physical Exam  Constitutional: She is oriented to person, place, and time. She appears well-developed and well-nourished.  Non-toxic appearance. She does not have a sickly appearance. She does not appear ill.  HENT:  Head: Normocephalic and atraumatic.  Mouth/Throat: Oropharynx is clear and moist.  Eyes: Conjunctivae are normal. Pupils are equal, round, and reactive to light.  Neck: Normal range of motion. Neck supple.  Cardiovascular: Normal rate, regular rhythm and normal heart sounds.   No murmur heard. Pulmonary/Chest: Effort normal and breath sounds normal. No accessory muscle usage or stridor. No respiratory distress. She has no wheezes. She has no rhonchi. She has no rales.  Abdominal: Soft. Bowel sounds are normal. She exhibits no distension. There is no tenderness.  Musculoskeletal: Normal range of motion.  Lymphadenopathy:    She has no cervical adenopathy.  Neurological: She is alert and oriented to person, place, and time.  Speech clear without dysarthria.  Skin: Skin is warm and dry.  Psychiatric:  She has a normal mood and affect. Her behavior is normal.    ED Course  Procedures (including critical care time) DIAGNOSTIC STUDIES: Oxygen Saturation is 98% on RA, normal by my interpretation.    COORDINATION OF CARE: 2:47 PM-Discussed treatment plan which includes work note with pt at bedside and pt agreed to plan.    Labs Review Labs Reviewed - No data to display  Imaging Review No results found. I have personally reviewed and evaluated these images and lab results as part of my medical decision-making.   EKG Interpretation None      MDM  Patient presents for work note.  Complete resolution  of symptoms.  Patient in NAD, afebrile, non-toxic, well appearing.  At this time there does not appear to be any evidence of an acute emergency medical condition and the patient appears stable for discharge with appropriate outpatient follow up.Diagnosis was discussed with patient who verbalizes understanding and is agreeable to discharge.   Final diagnoses:  Encounter to obtain excuse from work    I personally performed the services described in this documentation, which was scribed in my presence. The recorded information has been reviewed and is accurate.    Cheri Fowler, PA-C 01/31/15 1530  Arby Barrette, MD 02/07/15 2351

## 2015-01-31 NOTE — Discharge Instructions (Signed)
Medical Screening Exam °A medical screening exam has been done. This exam helps find the cause of your problem and determines whether you need emergency treatment. Your exam has shown that you do not need emergency treatment at this point. It is safe for you to go to your caregiver's office or clinic for treatment. You should make an appointment today to see your caregiver as soon as he or she is available. °Depending on your illness, your symptoms and condition can change over time. If your condition gets worse or you develop new or troubling symptoms before you see your caregiver, you should return to the emergency department for further evaluation.  °  °This information is not intended to replace advice given to you by your health care provider. Make sure you discuss any questions you have with your health care provider. °  °Document Released: 01/28/2004 Document Revised: 01/10/2014 Document Reviewed: 09/08/2010 °Elsevier Interactive Patient Education ©2016 Elsevier Inc. ° °

## 2015-01-31 NOTE — ED Notes (Signed)
Pt reports that her children had the stomach virus last week and on Tuesday she began having symptoms. Pt symptoms are resolved and wanted to go to work today but was told she needed to have a work note since she missed three days.

## 2015-05-11 ENCOUNTER — Encounter (HOSPITAL_COMMUNITY): Payer: Self-pay | Admitting: *Deleted

## 2015-05-11 ENCOUNTER — Emergency Department (HOSPITAL_COMMUNITY)
Admission: EM | Admit: 2015-05-11 | Discharge: 2015-05-11 | Disposition: A | Discharge status: Home / Self Care | Payer: Self-pay | Attending: Emergency Medicine | Admitting: Emergency Medicine

## 2015-05-11 DIAGNOSIS — R238 Other skin changes: Secondary | ICD-10-CM

## 2015-05-11 DIAGNOSIS — Z87891 Personal history of nicotine dependence: Secondary | ICD-10-CM | POA: Insufficient documentation

## 2015-05-11 DIAGNOSIS — L988 Other specified disorders of the skin and subcutaneous tissue: Secondary | ICD-10-CM | POA: Insufficient documentation

## 2015-05-11 DIAGNOSIS — Z79899 Other long term (current) drug therapy: Secondary | ICD-10-CM | POA: Insufficient documentation

## 2015-05-11 DIAGNOSIS — Z8619 Personal history of other infectious and parasitic diseases: Secondary | ICD-10-CM | POA: Insufficient documentation

## 2015-05-11 DIAGNOSIS — L539 Erythematous condition, unspecified: Secondary | ICD-10-CM | POA: Insufficient documentation

## 2015-05-11 DIAGNOSIS — Z972 Presence of dental prosthetic device (complete) (partial): Secondary | ICD-10-CM | POA: Insufficient documentation

## 2015-05-11 NOTE — ED Notes (Signed)
Declined W/C at D/C and was escorted to lobby by RN. 

## 2015-05-11 NOTE — Discharge Instructions (Signed)
Apply neosporin cream to affected area twice daily as needed.  Return if you notice increasing pain, redness, swelling or red streak extending away from the site.

## 2015-05-11 NOTE — ED Notes (Signed)
Pt reports having bump on right side of face that became unusually large several days ago. Pt unable to work and wants checked for something contagious since she works in [employer]. Small size bump noted to face. Airway intact.

## 2015-05-11 NOTE — ED Provider Notes (Signed)
CSN: [id number]     Arrival date & time 05/11/15  1050 History  By signing my name below, I, Sonum Patel, attest that this documentation has been prepared under the direction and in the presence of Fayrene Helper, PA-C. Electronically Signed: Sonum Patel, Neurosurgeon. 05/11/2015. 12:34 PM.    Chief Complaint  Patient presents with  . Rash   The history is provided by the patient. No language interpreter was used.     HPI Comments: [name] is a 30 y.o. female who presents to the Emergency Department complaining of gradual onset, gradually worsened bump to the right upper lip for the last few days. Patient states she popped the area and now it has swollen. 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 She denies fever, dental pain. She denies history of DM or HIV. Pt report pain is better and right now she has minimal pain.     Past Medical History  Diagnosis Date  . Gonorrhea   . Headache(784.0)     H/O migraines  . Pregnancy induced hypertension     readm 1 wk after 4th delivery   Past Surgical History  Procedure Laterality Date  . Cesarean section    . Induced abortion     Family History  Problem Relation Age of Onset  . Anesthesia problems Neg Hx    Social History  Substance Use Topics  . Smoking status: Former Smoker    Quit date: 01/05/2011  . Smokeless tobacco: Never Used     Comment: quit in March 2013  . Alcohol Use: No   OB History    Gravida Para Term Preterm AB TAB SAB Ectopic Multiple Living   0 1 1 0 0 0 5     Review of Systems  Constitutional: Negative for fever.  Skin:       +bump +swelling      Allergies  Review of patient's allergies indicates no known allergies.  Home Medications   Prior to Admission medications   Medication Sig Start Date End Date Taking? Authorizing Provider  fluconazole (DIFLUCAN) 150 MG tablet Take 1 tablet (150 mg total) by mouth daily. 02/04/13   Delbert Phenix, NP  metroNIDAZOLE  (FLAGYL) 500 MG tablet Take 1 tablet (500 mg total) by mouth 2 (two) times daily. 02/04/13   Delbert Phenix, NP   BP 137/83 mmHg  Pulse 60  Temp(Src) 98.6 F (37 C) (Oral)  Resp 18  SpO2 100%  LMP 04/27/2015 Physical Exam  Constitutional: She is oriented to person, place, and time. She appears well-developed and well-nourished.  HENT:  Head: Normocephalic and atraumatic.  Cardiovascular: Normal rate.   Pulmonary/Chest: Effort normal.  Neurological: She is alert and oriented to person, place, and time.  Skin: Skin is warm and dry.  3mm localized skin irritation noted to the right area above the lip adjacent to the nose without any surrounding skin erythema. No abscess noted.   Psychiatric: She has a normal mood and affect.  Nursing note and vitals reviewed.   ED Course  Procedures (including critical care time)  DIAGNOSTIC STUDIES: Oxygen Saturation is 100% on RA, normal by my interpretation.    COORDINATION OF CARE: 12:39 PM Discussed treatment plan with pt at bedside and pt agreed to plan.   Pt with localized skin irritation above R side of lip.  No evidence of infection or abscess.      MDM   Final diagnoses:  Skin irritation    BP 137/83 mmHg  Pulse 60  Temp(Src) 98.6 F (37 C) (Oral)  Resp 18  SpO2 100%  LMP 04/27/2015   I personally performed the services described in this documentation, which was scribed in my presence. The recorded information has been reviewed and is accurate.     Fayrene HelperBowie Sabre Leonetti, PA-C 05/11/15 1255  Alvira MondayErin Schlossman, MD 05/12/15 1312

## 2015-06-09 ENCOUNTER — Emergency Department (HOSPITAL_COMMUNITY)
Admission: EM | Admit: 2015-06-09 | Discharge: 2015-06-09 | Disposition: A | Discharge status: Home / Self Care | Payer: Self-pay | Attending: Emergency Medicine | Admitting: Emergency Medicine

## 2015-06-09 ENCOUNTER — Encounter (HOSPITAL_COMMUNITY): Payer: Self-pay

## 2015-06-09 DIAGNOSIS — Z9889 Other specified postprocedural states: Secondary | ICD-10-CM | POA: Insufficient documentation

## 2015-06-09 DIAGNOSIS — R1084 Generalized abdominal pain: Secondary | ICD-10-CM | POA: Insufficient documentation

## 2015-06-09 DIAGNOSIS — Z8619 Personal history of other infectious and parasitic diseases: Secondary | ICD-10-CM | POA: Insufficient documentation

## 2015-06-09 DIAGNOSIS — R109 Unspecified abdominal pain: Secondary | ICD-10-CM

## 2015-06-09 DIAGNOSIS — Z87891 Personal history of nicotine dependence: Secondary | ICD-10-CM | POA: Insufficient documentation

## 2015-06-09 DIAGNOSIS — Z8679 Personal history of other diseases of the circulatory system: Secondary | ICD-10-CM | POA: Insufficient documentation

## 2015-06-09 DIAGNOSIS — Z3202 Encounter for pregnancy test, result negative: Secondary | ICD-10-CM | POA: Insufficient documentation

## 2015-06-09 LAB — CBC
HCT: 38.1 % (ref 36.0–46.0)
Hemoglobin: 12.4 g/dL (ref 12.0–15.0)
MCH: 30 pg (ref 26.0–34.0)
MCHC: 32.5 g/dL (ref 30.0–36.0)
MCV: 92.3 fL (ref 78.0–100.0)
PLATELETS: 252 10*3/uL (ref 150–400)
RBC: 4.13 MIL/uL (ref 3.87–5.11)
RDW: 15 % (ref 11.5–15.5)
WBC: 9 10*3/uL (ref 4.0–10.5)

## 2015-06-09 LAB — COMPREHENSIVE METABOLIC PANEL
ALBUMIN: 3.8 g/dL (ref 3.5–5.0)
ALT: 20 U/L (ref 14–54)
AST: 19 U/L (ref 15–41)
Alkaline Phosphatase: 42 U/L (ref 38–126)
Anion gap: 6 (ref 5–15)
BILIRUBIN TOTAL: 0.5 mg/dL (ref 0.3–1.2)
BUN: 12 mg/dL (ref 6–20)
CALCIUM: 9.5 mg/dL (ref 8.9–10.3)
CO2: 27 mmol/L (ref 22–32)
CREATININE: 0.78 mg/dL (ref 0.44–1.00)
Chloride: 106 mmol/L (ref 101–111)
Glucose, Bld: 84 mg/dL (ref 65–99)
Potassium: 3.8 mmol/L (ref 3.5–5.1)
SODIUM: 139 mmol/L (ref 135–145)
TOTAL PROTEIN: 6.4 g/dL — AB (ref 6.5–8.1)

## 2015-06-09 LAB — LIPASE, BLOOD: LIPASE: 27 U/L (ref 11–51)

## 2015-06-09 LAB — URINALYSIS, ROUTINE W REFLEX MICROSCOPIC
Bilirubin Urine: NEGATIVE
Glucose, UA: NEGATIVE mg/dL
Hgb urine dipstick: NEGATIVE
KETONES UR: NEGATIVE mg/dL
LEUKOCYTES UA: NEGATIVE
Nitrite: NEGATIVE
PROTEIN: NEGATIVE mg/dL
Specific Gravity, Urine: 1.029 (ref 1.005–1.030)
pH: 6.5 (ref 5.0–8.0)

## 2015-06-09 LAB — POC URINE PREG, ED: PREG TEST UR: NEGATIVE

## 2015-06-09 MED ORDER — SUCRALFATE 1 G PO TABS: 1.0000 g | ORAL_TABLET | Freq: Three times a day (TID) | ORAL | Status: AC

## 2015-06-09 MED ORDER — DICYCLOMINE HCL 20 MG PO TABS: 20.0000 mg | ORAL_TABLET | Freq: Two times a day (BID) | ORAL | Status: AC

## 2015-06-09 NOTE — ED Notes (Signed)
Called to pt to reasess vitals, no response.

## 2015-06-09 NOTE — Discharge Instructions (Signed)
Your labs today were normal. I will give you a couple medications to try to help with your pain. Please call [facility] to schedule a follow up appointment. Return to the ER for new or worsening symptoms.

## 2015-06-09 NOTE — ED Provider Notes (Signed)
CSN: [id number]     Arrival date & time 06/09/15  1149 History   First MD Initiated Contact with Patient 06/09/15 1522     Chief Complaint  Patient presents with  . Abdominal Pain     HPI   [name] is an 30 y.o. female who presents to the ED for evaluation of right sided abdominal pain. She states the pain has been presents for three weeks intermittently. She states that three nights ago the pain spiked in severity and was so bad she had two episodes of NBNB emesis. She states since then the pain has improved in severity. She states it waxes and wanes. She states it typically starts at night while she is in bed and gets worse as the day goes on. She states sometimes the pain is worse with movement. Denies dysuria, urinary frequency/urgency, fever, chills, nausea, vomiting, diarrhea. Denies vaginal complaints. Denies EtOH or other drug use.   Past Medical History  Diagnosis Date  . Gonorrhea   . Headache(784.0)     H/O migraines  . Pregnancy induced hypertension     readm 1 wk after 4th delivery   Past Surgical History  Procedure Laterality Date  . Cesarean section    . Induced abortion     Family History  Problem Relation Age of Onset  . Anesthesia problems Neg Hx    Social History  Substance Use Topics  . Smoking status: Former Smoker    Quit date: 01/05/2011  . Smokeless tobacco: Never Used     Comment: quit in March 2013  . Alcohol Use: No   OB History    Gravida Para Term Preterm AB TAB SAB Ectopic Multiple Living   0 1 1 0 0 0 5     Review of Systems  All other systems reviewed and are negative.     Allergies  Review of patient's allergies indicates no known allergies.  Home Medications   Prior to Admission medications   Medication Sig Start Date End Date Taking? Authorizing Provider  acetaminophen (TYLENOL) 500 MG tablet Take 1,000 mg by mouth daily as needed for moderate pain.   Yes Historical Provider, MD   BP 126/77 mmHg  Pulse 80   Temp(Src) 99 F (37.2 C) (Oral)  Resp 18  SpO2 100%  LMP 04/27/2015 Physical Exam  Constitutional: She is oriented to person, place, and time.  Slightly uncomfortable appearing but NAD  HENT:  Right Ear: External ear normal.  Left Ear: External ear normal.  Nose: Nose normal.  Mouth/Throat: Oropharynx is clear and moist. No oropharyngeal exudate.  Eyes: Conjunctivae and EOM are normal. Pupils are equal, round, and reactive to light.  Neck: Normal range of motion. Neck supple.  Cardiovascular: Normal rate, regular rhythm, normal heart sounds and intact distal pulses.   Pulmonary/Chest: Effort normal and breath sounds normal. No respiratory distress. She has no wheezes. She exhibits no tenderness.  Abdominal: Soft. Bowel sounds are normal. She exhibits no distension. There is no rebound and no guarding.  Diffuse right sided abd ttp without guarding. No rebound. No CVA tenderness  Musculoskeletal: She exhibits no edema.  Neurological: She is alert and oriented to person, place, and time. No cranial nerve deficit.  Skin: Skin is warm and dry.  Psychiatric: She has a normal mood and affect.  Nursing note and vitals reviewed.   ED Course  Procedures (including critical care time) Labs Review Labs Reviewed  COMPREHENSIVE METABOLIC PANEL - Abnormal; Notable for  the following:    Total Protein 6.4 (*)    All other components within normal limits  LIPASE, BLOOD  CBC  URINALYSIS, ROUTINE W REFLEX MICROSCOPIC (NOT AT [facility])  POC URINE PREG, ED    Imaging Review No results found. I have personally reviewed and evaluated these images and lab results as part of my medical decision-making.   EKG Interpretation None      MDM   Final diagnoses:  Right sided abdominal pain    Pt is an otherwise healthy 30 y.o. female who presents with right sided abdominal pain over the past three weeks, worsened a few days ago, now slightly improved in severity. No fever, urinary symptoms, n/v/d.  Labs unremarkable. Pain is reportedly worse with movement. No peritoneal signs on exam. Doubt cholecystitis, appendicitis, SBO, or other emergent intra-abdominal etiology at this time. I did discuss CT abd/pelvis with pt given duration and description of severity of symtpoms with pain worse with ambulation/movement but pt would like to defer scan today. She is tolerating PO and nontoxic appearing. Will send home with trial of bentyl and carafte. Referred to GI for follow up. ER return precautions given.    Carlene CoriaSerena Y Jaimon Bugaj, PA-C 06/09/15 1654  Pricilla LovelessScott Goldston, MD 06/09/15 [phone]

## 2015-06-09 NOTE — ED Notes (Signed)
Asked pt. If she needed nausea medication, she stated, "No"

## 2015-06-09 NOTE — ED Notes (Signed)
Pt. Having intermittent  RLQ pain for 3 weeks with intermittent n/v.  Pt. Is having constipation.  Last Bm 2 days ago Pt. Denies any UTI symptoms, She describes the sensation in her abdomen as burning and tenderness.  Skin is warm and dry.  Denies any vaginal bleeding or discharge.

## 2017-11-14 ENCOUNTER — Other Ambulatory Visit: Payer: Self-pay

## 2017-11-14 ENCOUNTER — Encounter (HOSPITAL_COMMUNITY): Payer: Self-pay | Admitting: *Deleted

## 2017-11-14 ENCOUNTER — Inpatient Hospital Stay (HOSPITAL_COMMUNITY)
Admission: AD | Admit: 2017-11-14 | Discharge: 2017-11-14 | Disposition: A | Discharge status: Home / Self Care | Payer: Self-pay | Source: Ambulatory Visit | Attending: Obstetrics & Gynecology | Admitting: Obstetrics & Gynecology

## 2017-11-14 DIAGNOSIS — G8929 Other chronic pain: Secondary | ICD-10-CM | POA: Insufficient documentation

## 2017-11-14 DIAGNOSIS — F17210 Nicotine dependence, cigarettes, uncomplicated: Secondary | ICD-10-CM | POA: Insufficient documentation

## 2017-11-14 DIAGNOSIS — R109 Unspecified abdominal pain: Secondary | ICD-10-CM | POA: Insufficient documentation

## 2017-11-14 DIAGNOSIS — Z3202 Encounter for pregnancy test, result negative: Secondary | ICD-10-CM | POA: Insufficient documentation

## 2017-11-14 LAB — CBC WITH DIFFERENTIAL/PLATELET
BASOS ABS: 0 10*3/uL (ref 0.0–0.1)
Basophils Relative: 0 %
Eosinophils Absolute: 0.2 10*3/uL (ref 0.0–0.5)
Eosinophils Relative: 3 %
HCT: 37.9 % (ref 36.0–46.0)
HEMOGLOBIN: 13 g/dL (ref 12.0–15.0)
LYMPHS ABS: 2.7 10*3/uL (ref 0.7–4.0)
LYMPHS PCT: 37 %
MCH: 31.5 pg (ref 26.0–34.0)
MCHC: 34.3 g/dL (ref 30.0–36.0)
MCV: 91.8 fL (ref 80.0–100.0)
Monocytes Absolute: 0.5 10*3/uL (ref 0.1–1.0)
Monocytes Relative: 6 %
NRBC: 0 % (ref 0.0–0.2)
Neutro Abs: 3.9 10*3/uL (ref 1.7–7.7)
Neutrophils Relative %: 54 %
Platelets: 246 10*3/uL (ref 150–400)
RBC: 4.13 MIL/uL (ref 3.87–5.11)
RDW: 14.4 % (ref 11.5–15.5)
WBC: 7.3 10*3/uL (ref 4.0–10.5)

## 2017-11-14 LAB — URINALYSIS, ROUTINE W REFLEX MICROSCOPIC
BILIRUBIN URINE: NEGATIVE
Glucose, UA: NEGATIVE mg/dL
Hgb urine dipstick: NEGATIVE
Ketones, ur: NEGATIVE mg/dL
Leukocytes, UA: NEGATIVE
NITRITE: NEGATIVE
Protein, ur: NEGATIVE mg/dL
SPECIFIC GRAVITY, URINE: 1.026 (ref 1.005–1.030)
pH: 5 (ref 5.0–8.0)

## 2017-11-14 LAB — AMYLASE: Amylase: 76 U/L (ref 28–100)

## 2017-11-14 LAB — LIPASE, BLOOD: Lipase: 22 U/L (ref 11–51)

## 2017-11-14 LAB — POCT PREGNANCY, URINE: Preg Test, Ur: NEGATIVE

## 2017-11-14 NOTE — MAU Note (Signed)
Been going on for about 6 months. Last Thur, started having bad stomach pains.  On Fri, the pain was so bad she threw up and had diarrhea. On Sat, pain continued, Rt side. That night she was sick again- diarrhea and vomiting. Still in pain from it.  Threw up again on Sunday and yesterday morning. No further diarrhea. No fever.  Can feel a mass or bulge on the rt t lower side.

## 2017-11-14 NOTE — Discharge Instructions (Signed)
Diet for Irritable Bowel Syndrome When you have irritable bowel syndrome (IBS), the foods you eat and your eating habits are very important. IBS may cause various symptoms, such as abdominal pain, constipation, or diarrhea. Choosing the right foods can help ease discomfort caused by these symptoms. Work with your health care provider and dietitian to find the best eating plan to help control your symptoms. What general guidelines do I need to follow?  Keep a food diary. This will help you identify foods that cause symptoms. Write down: ? What you eat and when. ? What symptoms you have. ? When symptoms occur in relation to your meals.  Avoid foods that cause symptoms. Talk with your dietitian about other ways to get the same nutrients that are in these foods.  Eat more foods that contain fiber. Take a fiber supplement if directed by your dietitian.  Eat your meals slowly, in a relaxed setting.  Aim to eat 5-6 small meals per day. Do not skip meals.  Drink enough fluids to keep your urine clear or pale yellow.  Ask your health care provider if you should take an over-the-counter probiotic during flare-ups to help restore healthy gut bacteria.  If you have cramping or diarrhea, try making your meals low in fat and high in carbohydrates. Examples of carbohydrates are pasta, rice, whole grain breads and cereals, fruits, and vegetables.  If dairy products cause your symptoms to flare up, try eating less of them. You might be able to handle yogurt better than other dairy products because it contains bacteria that help with digestion. What foods are not recommended? The following are some foods and drinks that may worsen your symptoms:  Fatty foods, such as French fries.  Milk products, such as cheese or ice cream.  Chocolate.  Alcohol.  Products with caffeine, such as coffee.  Carbonated drinks, such as soda.  The items listed above may not be a complete list of foods and beverages to  avoid. Contact your dietitian for more information. What foods are good sources of fiber? Your health care provider or dietitian may recommend that you eat more foods that contain fiber. Fiber can help reduce constipation and other IBS symptoms. Add foods with fiber to your diet a little at a time so that your body can get used to them. Too much fiber at once might cause gas and swelling of your abdomen. The following are some foods that are good sources of fiber:  Apples.  Peaches.  Pears.  Berries.  Figs.  Broccoli (raw).  Cabbage.  Carrots.  Raw peas.  Kidney beans.  Lima beans.  Whole grain bread.  Whole grain cereal.  Where to find more information: International Foundation for Functional Gastrointestinal Disorders: www.iffgd.org National Institute of Diabetes and Digestive and Kidney Diseases: www.niddk.nih.gov/health-information/health-topics/digestive-diseases/ibs/Pages/facts.aspx This information is not intended to replace advice given to you by your health care provider. Make sure you discuss any questions you have with your health care provider. Document Released: 03/12/2003 Document Revised: 05/28/2015 Document Reviewed: 03/22/2013 Elsevier Interactive Patient Education  2018 Elsevier Inc.  

## 2017-11-14 NOTE — MAU Provider Note (Signed)
History     CSN: [id number]  Arrival date and time: 11/14/17 1145   First Provider Initiated Contact with Patient 11/14/17 1253      Chief Complaint  Patient presents with  . Abdominal Pain   HPI   [name] is a 32 y.o. female non pregnant here with chronic right sided abdominal pain. She was last seen in the ED in 2017 for this pain and says they were not able to determine the cause. She is still having regular menstrual cycles, LMP 10/20; the pain usually comes after her period and during/ after intercourse. History of C-section x4; no other abdominal surgeries. At times when the pain comes on it is associated with both N/V/D. At times she notices that there is a bulge in the right side of her abdomen. The pain is mid/right side. Denies RLQ pain. No pain now. The pain waxes and wanes.  No fevers.   Currently she has no insurance and has not been able to establish care with PCP or GYN. The only time she has been seen for this pain has been at an ER.   OB History    Gravida  6   Para  5   Term  5   Preterm  0   AB  1   Living  5     SAB  0   TAB  1   Ectopic  0   Multiple  0   Live Births  5           Past Medical History:  Diagnosis Date  . Gonorrhea   . Headache(784.0)    H/O migraines  . Pregnancy induced hypertension    readm 1 wk after 4th delivery    Past Surgical History:  Procedure Laterality Date  . CESAREAN SECTION     C/S x 4  . INDUCED ABORTION    . TUBAL LIGATION      Family History  Problem Relation Age of Onset  . Anesthesia problems Neg Hx     Social History   Tobacco Use  . Smoking status: Current Every Day Smoker    Packs/day: 0.50    Types: Cigarettes    Last attempt to quit: 01/05/2011    Years since quitting: 6.8  . Smokeless tobacco: Never Used  . Tobacco comment: quit in March 2013  Substance Use Topics  . Alcohol use: No  . Drug use: No    Allergies: No Known Allergies  Medications Prior to  Admission  Medication Sig Dispense Refill Last Dose  . acetaminophen (TYLENOL) 500 MG tablet Take 1,000 mg by mouth daily as needed for moderate pain.   06/07/2015  . dicyclomine (BENTYL) 20 MG tablet Take 1 tablet (20 mg total) by mouth 2 (two) times daily. 20 tablet 0   . sucralfate (CARAFATE) 1 g tablet Take 1 tablet (1 g total) by mouth 4 (four) times daily -  with meals and at bedtime. 30 tablet 0    Results for orders placed or performed during the hospital encounter of 11/14/17 (from the past 48 hour(s))  Urinalysis, Routine w reflex microscopic     Status: Abnormal   Collection Time: 11/14/17 12:11 PM  Result Value Ref Range   Color, Urine YELLOW YELLOW   APPearance HAZY (A) CLEAR   Specific Gravity, Urine 1.026 1.005 - 1.030   pH 5.0 5.0 - 8.0   Glucose, UA NEGATIVE NEGATIVE mg/dL   Hgb urine dipstick NEGATIVE NEGATIVE  Bilirubin Urine NEGATIVE NEGATIVE   Ketones, ur NEGATIVE NEGATIVE mg/dL   Protein, ur NEGATIVE NEGATIVE mg/dL   Nitrite NEGATIVE NEGATIVE   Leukocytes, UA NEGATIVE NEGATIVE    Comment: Performed at [facility], [address] [place], [place] [address]  Pregnancy, urine POC     Status: None   Collection Time: 11/14/17 12:13 PM  Result Value Ref Range   Preg Test, Ur NEGATIVE NEGATIVE    Comment:        THE SENSITIVITY OF THIS METHODOLOGY IS >24 mIU/mL   CBC with Differential     Status: None   Collection Time: 11/14/17 12:32 PM  Result Value Ref Range   WBC 7.3 4.0 - 10.5 K/uL   RBC 4.13 3.87 - 5.11 MIL/uL   Hemoglobin 13.0 12.0 - 15.0 g/dL   HCT 60.4 54.0 - 98.1 %   MCV 91.8 80.0 - 100.0 fL   MCH 31.5 26.0 - 34.0 pg   MCHC 34.3 30.0 - 36.0 g/dL   RDW 19.1 47.8 - 29.5 %   Platelets 246 150 - 400 K/uL   nRBC 0.0 0.0 - 0.2 %   Neutrophils Relative % 54 %   Neutro Abs 3.9 1.7 - 7.7 K/uL   Lymphocytes Relative 37 %   Lymphs Abs 2.7 0.7 - 4.0 K/uL   Monocytes Relative 6 %   Monocytes Absolute 0.5 0.1 - 1.0 K/uL   Eosinophils Relative 3 %    Eosinophils Absolute 0.2 0.0 - 0.5 K/uL   Basophils Relative 0 %   Basophils Absolute 0.0 0.0 - 0.1 K/uL    Comment: Performed at [facility], [address], [place], [place] [address]  Amylase     Status: None   Collection Time: 11/14/17 12:32 PM  Result Value Ref Range   Amylase 76 28 - 100 U/L    Comment: Performed at [facility], [address], [place], [place] [address]  Lipase, blood     Status: None   Collection Time: 11/14/17 12:32 PM  Result Value Ref Range   Lipase 22 11 - 51 U/L    Comment: Performed at [facility], [address] [place], [place] [address]   Review of Systems  Constitutional: Negative for fever.  Gastrointestinal: Positive for diarrhea. Negative for constipation and vomiting.   Physical Exam   Blood pressure 135/78, pulse 79, temperature 98.2 F (36.8 C), temperature source Oral, resp. rate 18, weight 79.4 kg, last menstrual period 10/22/2017, SpO2 100 %.  Physical Exam  Constitutional: She is oriented to person, place, and time. She appears well-developed and well-nourished. No distress.  HENT:  Head: Normocephalic.  GI: There is no splenomegaly or hepatomegaly. There is tenderness in the periumbilical area. There is no rigidity, no rebound and no guarding. No hernia.    Genitourinary:  Genitourinary Comments: Patient refused STI testing Bimanual exam: Cervix closed, posterior. No CMT. No adnexal tenderness. No uterine tenderness.   Musculoskeletal: Normal range of motion.  Neurological: She is alert and oriented to person, place, and time.  Skin: Skin is warm. She is not diaphoretic.  Psychiatric: Her behavior is normal.   MAU Course  Procedures  None  MDM  CBC, Lipase, Amylase.  Patient refused STI testing. Patient without patient now, pain is intermittent. Patient was given several contact numbers for PCP's who accept patient's without insurance.     Assessment and Plan   A:  1. Chronic abdominal pain      P:  Discharge home in stable  condition Establish care with PCP for annual exams as well Likely 2/2 to GI Discussed changing diet, avoiding fast foods and fried foods.  Return to MAU with any GYN emergencies  Rasch, Harolyn RutherfordJennifer I, NP 11/14/2017 2:23 PM

## 2019-04-22 ENCOUNTER — Encounter (HOSPITAL_COMMUNITY): Payer: Self-pay | Admitting: Emergency Medicine

## 2019-04-22 ENCOUNTER — Emergency Department (HOSPITAL_COMMUNITY)
Admission: EM | Admit: 2019-04-22 | Discharge: 2019-04-22 | Disposition: A | Discharge status: Home / Self Care | Payer: HRSA Program | Attending: Emergency Medicine | Admitting: Emergency Medicine

## 2019-04-22 ENCOUNTER — Other Ambulatory Visit: Payer: Self-pay

## 2019-04-22 DIAGNOSIS — U071 COVID-19: Secondary | ICD-10-CM | POA: Insufficient documentation

## 2019-04-22 DIAGNOSIS — Z5321 Procedure and treatment not carried out due to patient leaving prior to being seen by health care provider: Secondary | ICD-10-CM | POA: Insufficient documentation

## 2019-04-22 NOTE — ED Notes (Signed)
Pt left, per registration.

## 2019-04-22 NOTE — ED Triage Notes (Signed)
Pt was tested positive on 4/08 and is here today to be retested or covid. I have explained to this patient she is only 10 days post covid and very likely will still be positive. Pt still request to see a doctor today.
